# Patient Record
Sex: Male | Born: 1960 | Race: Asian | Hispanic: No | Marital: Married | Smoking: Former smoker
Health system: Southern US, Community
[De-identification: ages and names within clinical notes are randomized; demographics above are authoritative.]

## PROBLEM LIST (undated history)

## (undated) DIAGNOSIS — M109 Gout, unspecified: Secondary | ICD-10-CM

## (undated) DIAGNOSIS — E785 Hyperlipidemia, unspecified: Secondary | ICD-10-CM

## (undated) DIAGNOSIS — I10 Essential (primary) hypertension: Secondary | ICD-10-CM

## (undated) HISTORY — DX: Gout, unspecified: M10.9

## (undated) HISTORY — DX: Hyperlipidemia, unspecified: E78.5

---

## 2015-10-02 ENCOUNTER — Ambulatory Visit: Payer: Self-pay | Admitting: Internal Medicine

## 2015-10-16 ENCOUNTER — Ambulatory Visit (INDEPENDENT_AMBULATORY_CARE_PROVIDER_SITE_OTHER): Payer: BLUE CROSS/BLUE SHIELD | Admitting: Internal Medicine

## 2015-10-16 ENCOUNTER — Encounter: Payer: Self-pay | Admitting: Internal Medicine

## 2015-10-16 ENCOUNTER — Ambulatory Visit (INDEPENDENT_AMBULATORY_CARE_PROVIDER_SITE_OTHER)
Admission: RE | Admit: 2015-10-16 | Discharge: 2015-10-16 | Disposition: A | Discharge status: Home / Self Care | Payer: BLUE CROSS/BLUE SHIELD | Source: Ambulatory Visit | Attending: Internal Medicine | Admitting: Internal Medicine

## 2015-10-16 ENCOUNTER — Other Ambulatory Visit (INDEPENDENT_AMBULATORY_CARE_PROVIDER_SITE_OTHER): Payer: BLUE CROSS/BLUE SHIELD

## 2015-10-16 VITALS — BP 122/70 | HR 66 | Temp 98.1°F | Resp 12 | Ht 66.0 in | Wt 165.0 lb

## 2015-10-16 DIAGNOSIS — R05 Cough: Secondary | ICD-10-CM

## 2015-10-16 DIAGNOSIS — R059 Cough, unspecified: Secondary | ICD-10-CM

## 2015-10-16 DIAGNOSIS — Z0000 Encounter for general adult medical examination without abnormal findings: Secondary | ICD-10-CM | POA: Diagnosis not present

## 2015-10-16 LAB — COMPREHENSIVE METABOLIC PANEL
ALT: 28 U/L (ref 0–53)
AST: 29 U/L (ref 0–37)
Albumin: 3.9 g/dL (ref 3.5–5.2)
Alkaline Phosphatase: 56 U/L (ref 39–117)
BUN: 20 mg/dL (ref 6–23)
CO2: 30 meq/L (ref 19–32)
Calcium: 9.5 mg/dL (ref 8.4–10.5)
Chloride: 106 mEq/L (ref 96–112)
Creatinine, Ser: 1.16 mg/dL (ref 0.40–1.50)
GFR: 69.88 mL/min (ref >60.00)
GLUCOSE: 96 mg/dL (ref 70–99)
POTASSIUM: 5.5 meq/L — AB (ref 3.5–5.1)
SODIUM: 141 meq/L (ref 135–145)
Total Bilirubin: 0.3 mg/dL (ref 0.2–1.2)
Total Protein: 6.7 g/dL (ref 6.0–8.3)

## 2015-10-16 LAB — LIPID PANEL
CHOL/HDL RATIO: 4
Cholesterol: 177 mg/dL (ref 0–200)
HDL: 40.1 mg/dL (ref >39.00)
LDL CALC: 97 mg/dL (ref 0–99)
NONHDL: 136.62
Triglycerides: 198 mg/dL — ABNORMAL HIGH (ref 0.0–149.0)
VLDL: 39.6 mg/dL (ref 0.0–40.0)

## 2015-10-16 LAB — CBC
HCT: 49.7 % (ref 39.0–52.0)
Hemoglobin: 16.6 g/dL (ref 13.0–17.0)
MCHC: 33.3 g/dL (ref 30.0–36.0)
MCV: 87.2 fl (ref 78.0–100.0)
Platelets: 72 10*3/uL — ABNORMAL LOW (ref 150.0–400.0)
RBC: 5.7 Mil/uL (ref 4.22–5.81)
RDW: 14.3 % (ref 11.5–15.5)
WBC: 9.1 10*3/uL (ref 4.0–10.5)

## 2015-10-16 NOTE — Progress Notes (Signed)
   Subjective:    Patient ID: Aaron Bender, male    DOB: Dec 04, 1961, 54 y.o.   MRN: [id number]  HPI The patient is a new 54 YO man coming in for wellness. He speaks english poorly and his daughter is with him for translation. No concerns.   [facility], social history reviewed and updated.   Review of Systems  Constitutional: Negative for fever, activity change, appetite change, fatigue and unexpected weight change.  HENT: Negative.   Eyes: Negative.   Respiratory: Negative for cough, choking and wheezing.   Cardiovascular: Negative for chest pain, palpitations and leg swelling.  Gastrointestinal: Negative for nausea, abdominal pain, diarrhea, constipation and abdominal distention.  Musculoskeletal: Negative.   Skin: Negative.   Neurological: Negative.   Psychiatric/Behavioral: Negative.       Objective:   Physical Exam  Constitutional: He is oriented to person, place, and time. He appears well-developed and well-nourished.  HENT:  Head: Normocephalic and atraumatic.  Eyes: EOM are normal.  Neck: Normal range of motion.  Cardiovascular: Normal rate and regular rhythm.   Pulmonary/Chest: Effort normal and breath sounds normal. No respiratory distress. He has no wheezes. He has no rales.  Abdominal: Soft. Bowel sounds are normal. He exhibits no distension. There is no tenderness. There is no rebound.  Musculoskeletal: He exhibits no edema.  Neurological: He is alert and oriented to person, place, and time. Coordination normal.  Skin: Skin is warm and dry.  Psychiatric: He has a normal mood and affect.   Filed Vitals:   10/16/15 0903  BP: 122/70  Pulse: 66  Temp: 98.1 F (36.7 C)  TempSrc: Oral  Resp: 12  Height: 5\' 6"  (1.676 m)  Weight: 165 lb (74.844 kg)  SpO2: 97%      Assessment & Plan:

## 2015-10-16 NOTE — Assessment & Plan Note (Signed)
Daughter states history of hepatitis B in his home country. Checking hepatitis panel and immunity for hep B. Checking routine labs and chest x-ray. Counseled about colon cancer screening and they will think about it. Exercises and active.

## 2015-10-16 NOTE — Patient Instructions (Signed)
We will check the blood work today and the chest x-ray.   Think about doing the colon cancer test at home sometime in the next year.   We will also check on the hepatitis to see if it is gone.   Health Maintenance, Male A healthy lifestyle and preventative care can promote health and wellness.  Maintain regular health, dental, and eye exams.  Eat a healthy diet. Foods like vegetables, fruits, whole grains, low-fat dairy products, and lean protein foods contain the nutrients you need and are low in calories. Decrease your intake of foods high in solid fats, added sugars, and salt. Get information about a proper diet from your health care provider, if necessary.  Regular physical exercise is one of the most important things you can do for your health. Most adults should get at least 150 minutes of moderate-intensity exercise (any activity that increases your heart rate and causes you to sweat) each week. In addition, most adults need muscle-strengthening exercises on 2 or more days a week.   Maintain a healthy weight. The body mass index (BMI) is a screening tool to identify possible weight problems. It provides an estimate of body fat based on height and weight. Your health care provider can find your BMI and can help you achieve or maintain a healthy weight. For males 20 years and older:  A BMI below 18.5 is considered underweight.  A BMI of 18.5 to 24.9 is normal.  A BMI of 25 to 29.9 is considered overweight.  A BMI of 30 and above is considered obese.  Maintain normal blood lipids and cholesterol by exercising and minimizing your intake of saturated fat. Eat a balanced diet with plenty of fruits and vegetables. Blood tests for lipids and cholesterol should begin at age 720 and be repeated every 5 years. If your lipid or cholesterol levels are high, you are over age 650, or you are at high risk for heart disease, you may need your cholesterol levels checked more frequently.Ongoing high  lipid and cholesterol levels should be treated with medicines if diet and exercise are not working.  If you smoke, find out from your health care provider how to quit. If you do not use tobacco, do not start.  Lung cancer screening is recommended for adults aged 55-80 years who are at high risk for developing lung cancer because of a history of smoking. A yearly low-dose CT scan of the lungs is recommended for people who have at least a 30-pack-year history of smoking and are current smokers or have quit within the past 15 years. A pack year of smoking is smoking an average of 1 pack of cigarettes a day for 1 year (for example, a 30-pack-year history of smoking could mean smoking 1 pack a day for 30 years or 2 packs a day for 15 years). Yearly screening should continue until the smoker has stopped smoking for at least 15 years. Yearly screening should be stopped for people who develop a health problem that would prevent them from having lung cancer treatment.  If you choose to drink alcohol, do not have more than 2 drinks per day. One drink is considered to be 12 oz (360 mL) of beer, 5 oz (150 mL) of wine, or 1.5 oz (45 mL) of liquor.  Avoid the use of street drugs. Do not share needles with anyone. Ask for help if you need support or instructions about stopping the use of drugs.  High blood pressure causes heart disease and  increases the risk of stroke. High blood pressure is more likely to develop in:  People who have blood pressure in the end of the normal range (100-139/85-89 mm Hg).  People who are overweight or obese.  People who are African American.  If you are 60-62 years of age, have your blood pressure checked every 3-5 years. If you are 51 years of age or older, have your blood pressure checked every year. You should have your blood pressure measured twice--once when you are at a hospital or clinic, and once when you are not at a hospital or clinic. Record the average of the two  measurements. To check your blood pressure when you are not at a hospital or clinic, you can use:  An automated blood pressure machine at a pharmacy.  A home blood pressure monitor.  If you are 1-33 years old, ask your health care provider if you should take aspirin to prevent heart disease.  Diabetes screening involves taking a blood sample to check your fasting blood sugar level. This should be done once every 3 years after age 61 if you are at a normal weight and without risk factors for diabetes. Testing should be considered at a younger age or be carried out more frequently if you are overweight and have at least 1 risk factor for diabetes.  Colorectal cancer can be detected and often prevented. Most routine colorectal cancer screening begins at the age of 14 and continues through age 10. However, your health care provider may recommend screening at an earlier age if you have risk factors for colon cancer. On a yearly basis, your health care provider may provide home test kits to check for hidden blood in the stool. A small camera at the end of a tube may be used to directly examine the colon (sigmoidoscopy or colonoscopy) to detect the earliest forms of colorectal cancer. Talk to your health care provider about this at age 87 when routine screening begins. A direct exam of the colon should be repeated every 5-10 years through age 60, unless early forms of precancerous polyps or small growths are found.  People who are at an increased risk for hepatitis B should be screened for this virus. You are considered at high risk for hepatitis B if:  You were born in a country where hepatitis B occurs often. Talk with your health care provider about which countries are considered high risk.  Your parents were born in a high-risk country and you have not received a shot to protect against hepatitis B (hepatitis B vaccine).  You have HIV or AIDS.  You use needles to inject street drugs.  You live  with, or have sex with, someone who has hepatitis B.  You are a man who has sex with other men (MSM).  You get hemodialysis treatment.  You take certain medicines for conditions like cancer, organ transplantation, and autoimmune conditions.  Hepatitis C blood testing is recommended for all people born from [birth year] through 1964 and any individual with known risk factors for hepatitis C.  Healthy men should no longer receive prostate-specific antigen (PSA) blood tests as part of routine cancer screening. Talk to your health care provider about prostate cancer screening.  Testicular cancer screening is not recommended for adolescents or adult males who have no symptoms. Screening includes self-exam, a health care provider exam, and other screening tests. Consult with your health care provider about any symptoms you have or any concerns you have about testicular cancer.  Practice safe sex. Use condoms and avoid high-risk sexual practices to reduce the spread of sexually transmitted infections (STIs).  You should be screened for STIs, including gonorrhea and chlamydia if:  You are sexually active and are younger than 24 years.  You are older than 24 years, and your health care provider tells you that you are at risk for this type of infection.  Your sexual activity has changed since you were last screened, and you are at an increased risk for chlamydia or gonorrhea. Ask your health care provider if you are at risk.  If you are at risk of being infected with HIV, it is recommended that you take a prescription medicine daily to prevent HIV infection. This is called pre-exposure prophylaxis (PrEP). You are considered at risk if:  You are a man who has sex with other men (MSM).  You are a heterosexual man who is sexually active with multiple partners.  You take drugs by injection.  You are sexually active with a partner who has HIV.  Talk with your health care provider about whether you are at  high risk of being infected with HIV. If you choose to begin PrEP, you should first be tested for HIV. You should then be tested every 3 months for as long as you are taking PrEP.  Use sunscreen. Apply sunscreen liberally and repeatedly throughout the day. You should seek shade when your shadow is shorter than you. Protect yourself by wearing long sleeves, pants, a wide-brimmed hat, and sunglasses year round whenever you are outdoors.  Tell your health care provider of new moles or changes in moles, especially if there is a change in shape or color. Also, tell your health care provider if a mole is larger than the size of a pencil eraser.  A one-time screening for abdominal aortic aneurysm (AAA) and surgical repair of large AAAs by ultrasound is recommended for men aged 8-75 years who are current or former smokers.  Stay current with your vaccines (immunizations).   This information is not intended to replace advice given to you by your health care provider. Make sure you discuss any questions you have with your health care provider.   Document Released: 06/09/2008 Document Revised: 01/02/2015 Document Reviewed: 05/09/2011 Elsevier Interactive Patient Education [employer].

## 2015-10-16 NOTE — Progress Notes (Signed)
Pre visit review using our clinic review tool, if applicable. No additional management support is needed unless otherwise documented below in the visit note. 

## 2015-10-18 LAB — ACUTE HEP PANEL AND HEP B SURFACE AB
HCV Ab: NEGATIVE
HEP B S AB: NEGATIVE
Hep A IgM: NONREACTIVE
Hep B C IgM: NONREACTIVE
Hepatitis B Surface Ag: POSITIVE — AB

## 2015-10-18 LAB — HEPATITIS B SURF AG CONFIRMATION: Hepatitis B Surf Ag Confirmation: POSITIVE — AB

## 2015-10-20 ENCOUNTER — Other Ambulatory Visit: Payer: Self-pay | Admitting: Internal Medicine

## 2015-10-20 DIAGNOSIS — B181 Chronic viral hepatitis B without delta-agent: Secondary | ICD-10-CM

## 2016-04-27 ENCOUNTER — Telehealth: Payer: Self-pay | Admitting: Internal Medicine

## 2016-04-27 DIAGNOSIS — R768 Other specified abnormal immunological findings in serum: Secondary | ICD-10-CM

## 2016-04-27 NOTE — Telephone Encounter (Signed)
Patient needs a referral to [facility] for positive Hep B dx. Are you able to place in Dr. Lawana Chambersrawfords absence?

## 2016-04-27 NOTE — Telephone Encounter (Signed)
Pt's daughter called want to speak to the assistant. Pt saw Dr. Okey Duprerawford last year and lab result was positive for Hep B ( they did not see this request until now). She was wondering what does she need to do in order for [facility]. Aaron Bender [facility] phn # [phone]. Advise dr. Okey Duprerawford is our of the office but want to speak to the assistant.

## 2016-04-27 NOTE — Telephone Encounter (Signed)
Referral placed.

## 2016-06-21 ENCOUNTER — Other Ambulatory Visit (HOSPITAL_COMMUNITY): Payer: Self-pay | Admitting: Nurse Practitioner

## 2016-06-21 DIAGNOSIS — B182 Chronic viral hepatitis C: Secondary | ICD-10-CM

## 2016-08-11 ENCOUNTER — Ambulatory Visit (HOSPITAL_COMMUNITY)
Admission: RE | Admit: 2016-08-11 | Discharge: 2016-08-11 | Disposition: A | Discharge status: Home / Self Care | Payer: BLUE CROSS/BLUE SHIELD | Source: Ambulatory Visit | Attending: Nurse Practitioner | Admitting: Nurse Practitioner

## 2016-08-11 DIAGNOSIS — K824 Cholesterolosis of gallbladder: Secondary | ICD-10-CM | POA: Insufficient documentation

## 2016-08-11 DIAGNOSIS — K838 Other specified diseases of biliary tract: Secondary | ICD-10-CM | POA: Diagnosis not present

## 2016-08-11 DIAGNOSIS — B182 Chronic viral hepatitis C: Secondary | ICD-10-CM | POA: Diagnosis present

## 2017-03-07 IMAGING — DX DG CHEST 2V
2 series · 2 of 2 positions shown · non-contrast
Comparison: None.

CLINICAL DATA: Cough

EXAM:
CHEST  2 VIEW

[chest pa]
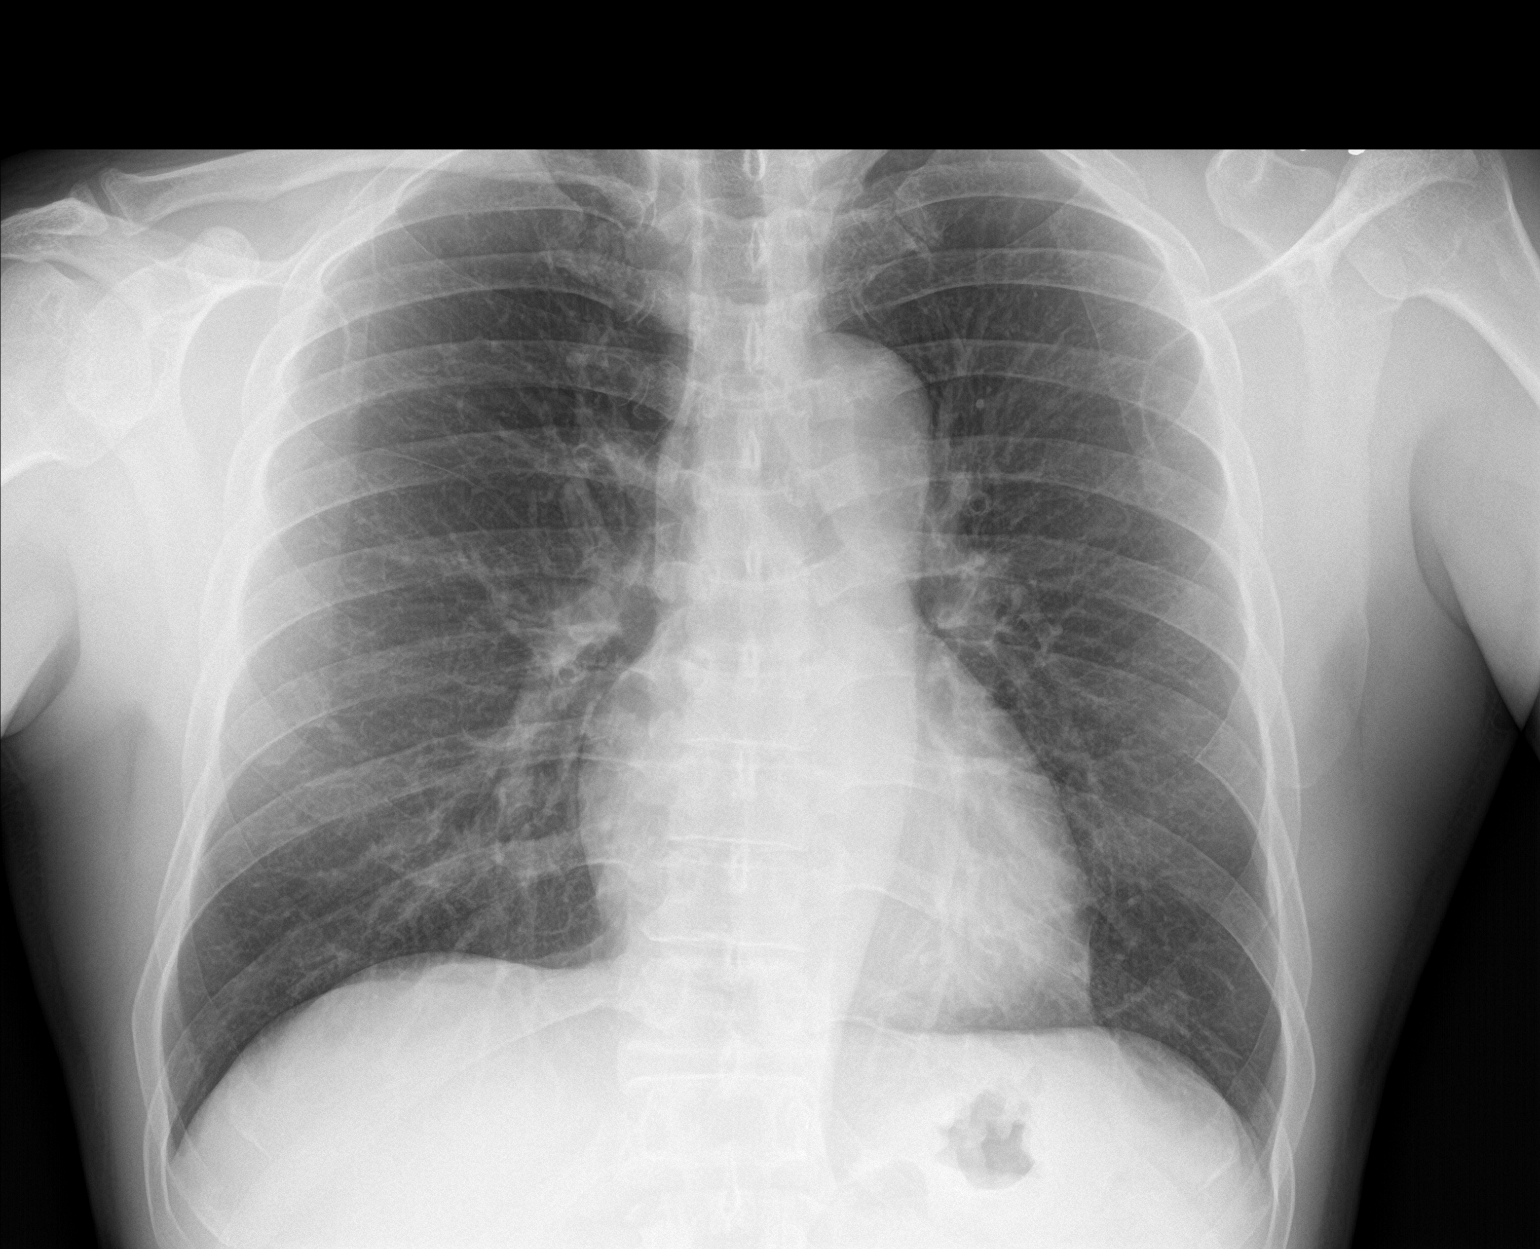

[chest lat]
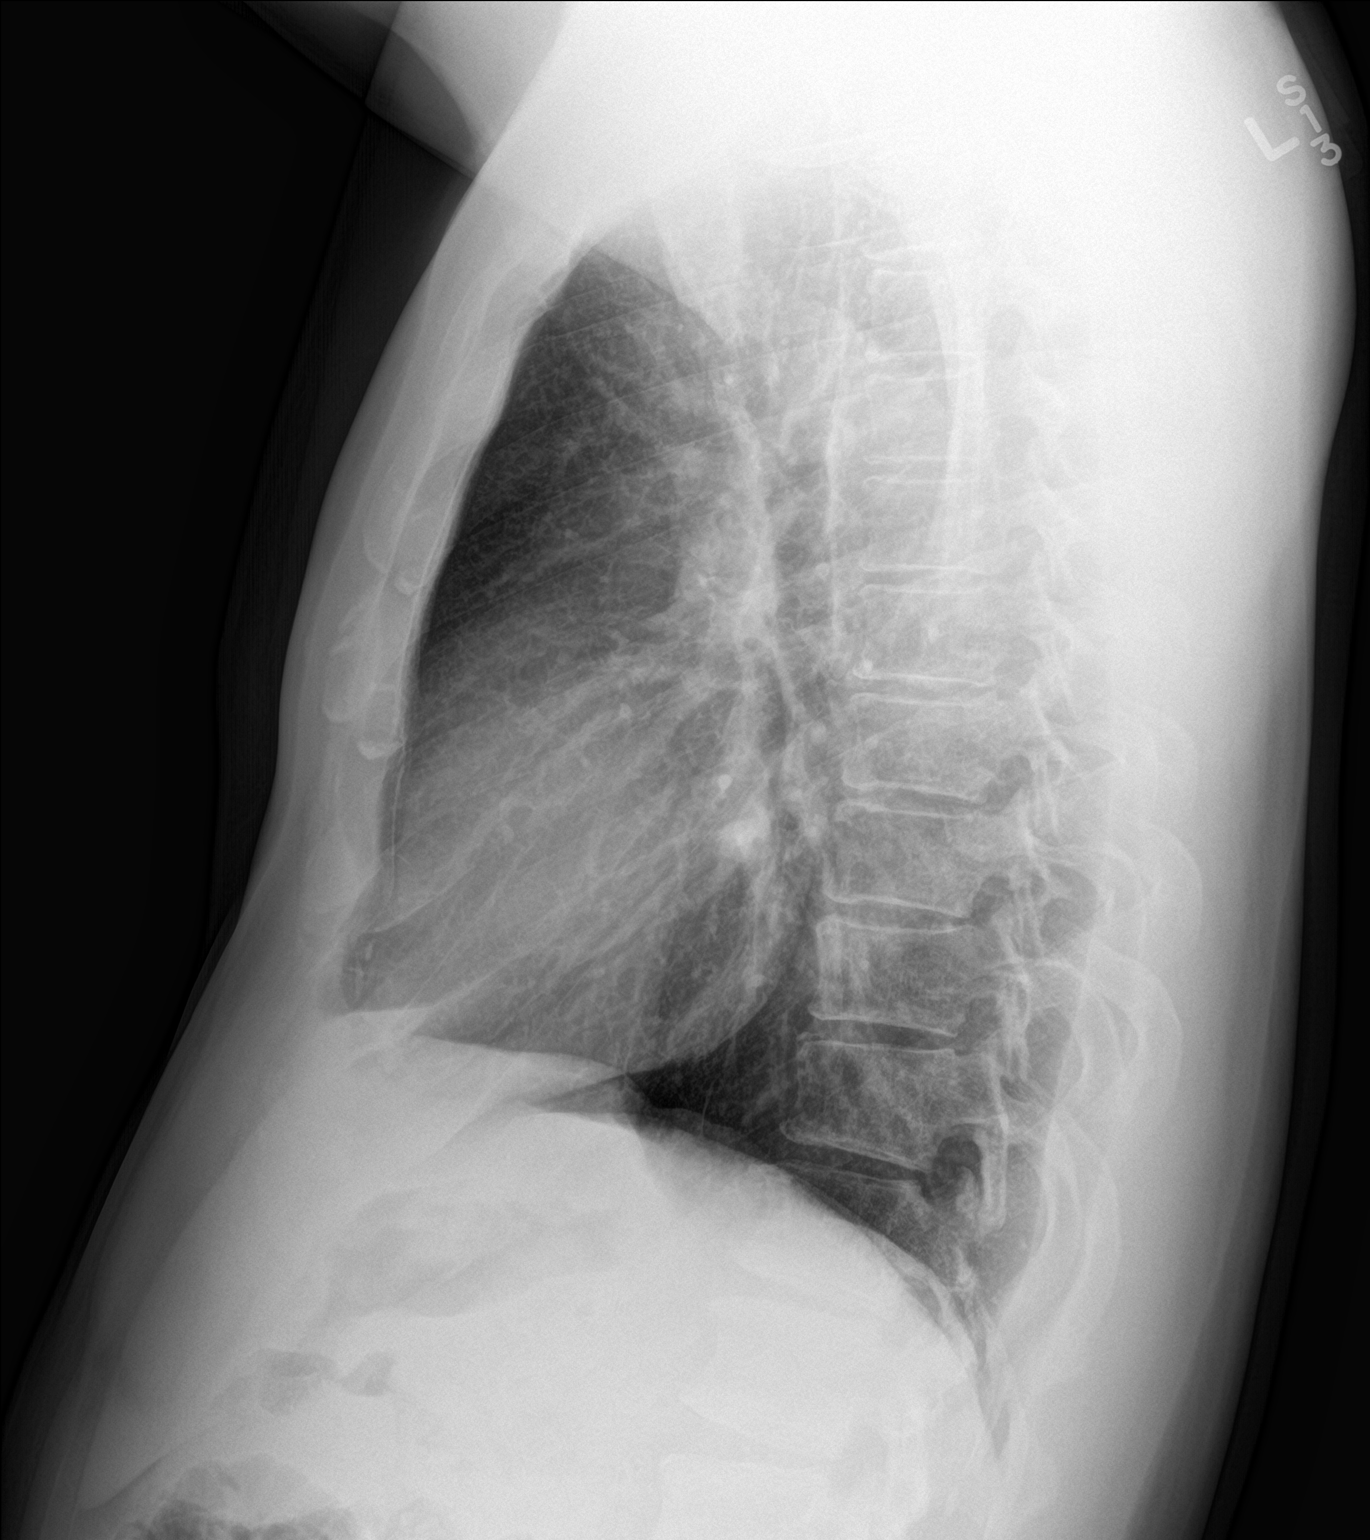

[2 of 2 positions shown; findings below may reference images not displayed]

FINDINGS: The heart size and mediastinal contours are within normal limits.
Both lungs are clear. The visualized skeletal structures are
unremarkable.
IMPRESSION: No active cardiopulmonary disease.

## 2017-11-14 IMAGING — US US ABDOMEN COMPLETE W/ ELASTOGRAPHY
1 series · 13 of 14 positions shown · non-contrast
Comparison: None.

CLINICAL DATA: Chronic hepatitis-B.



[Series 1: us abdomen complete w/ elastography · 0.12mm/px · 13 of 14 slices shown]
[im 1/14]
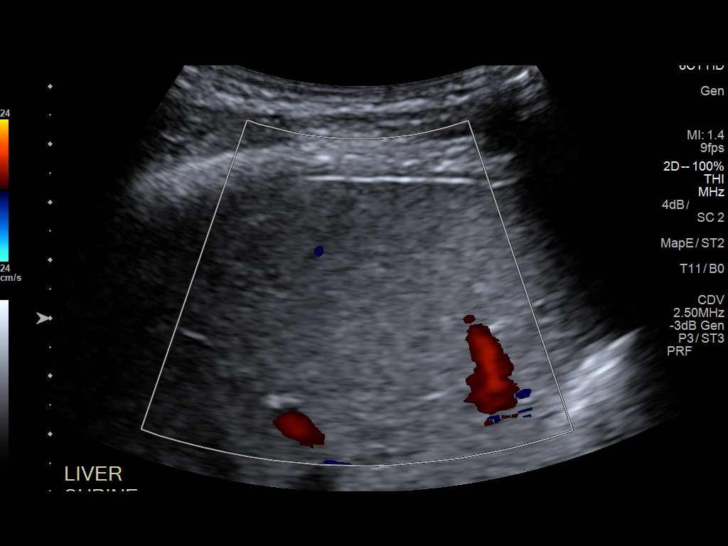
[im 2/14]
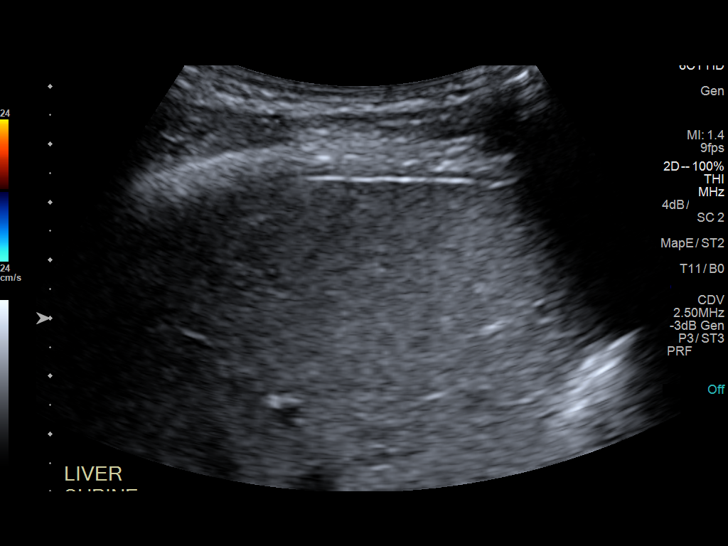
[im 3/14]
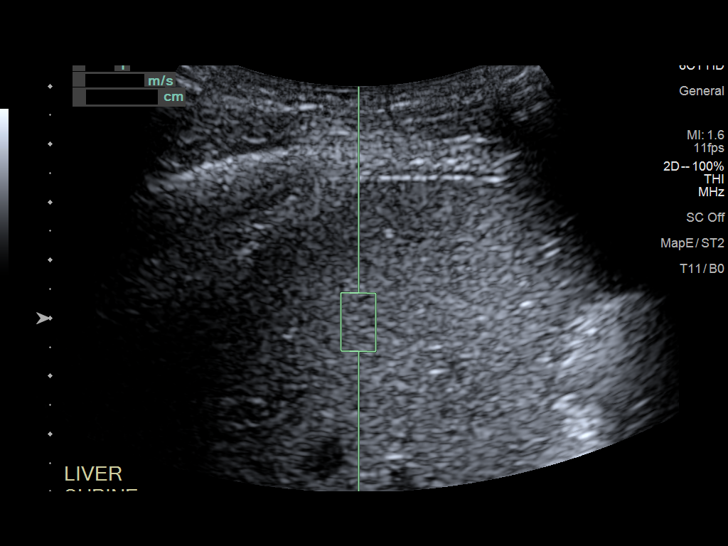
[im 4/14]
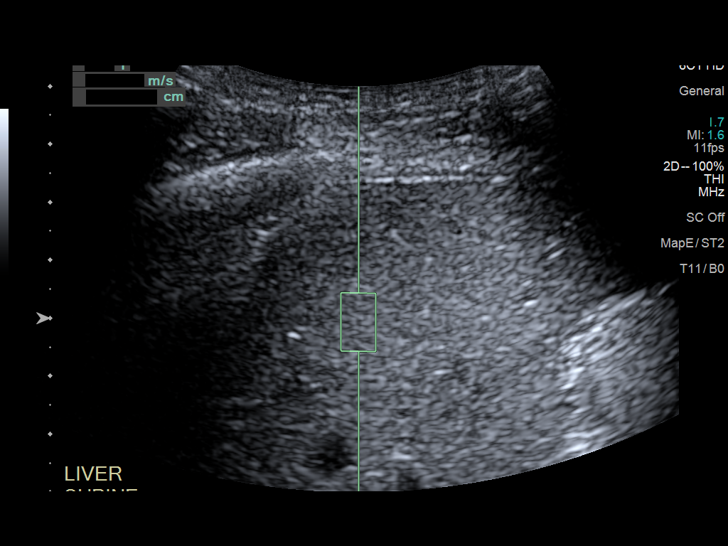
[im 5/14]
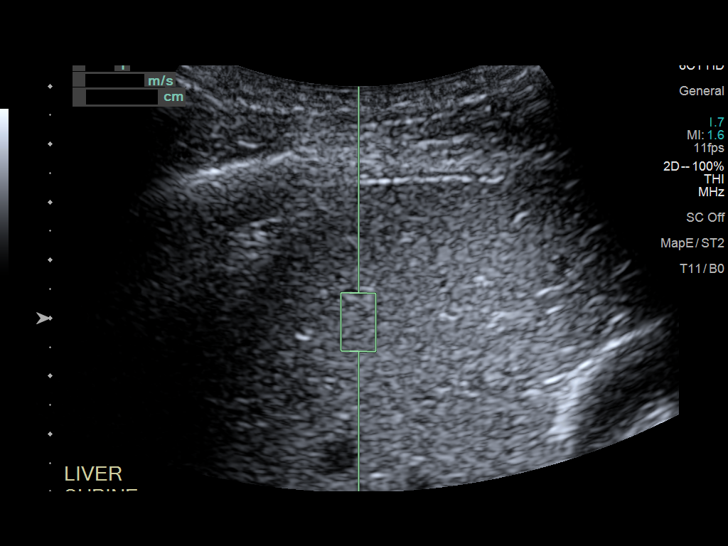
[im 6/14]
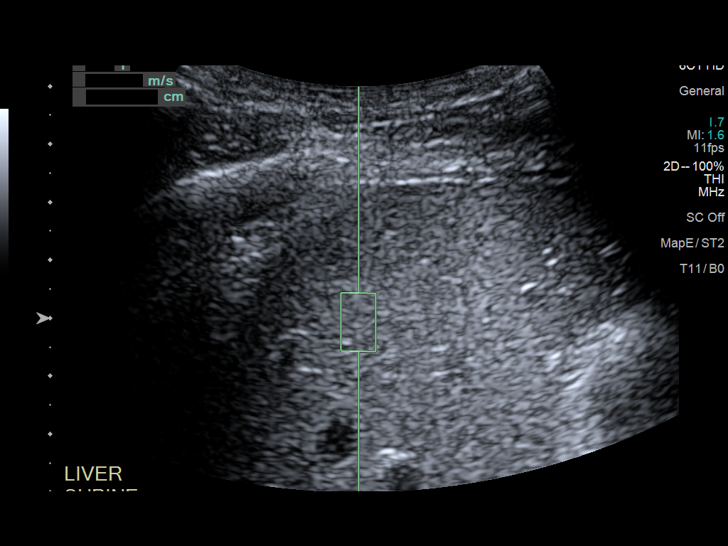
[im 8/14]
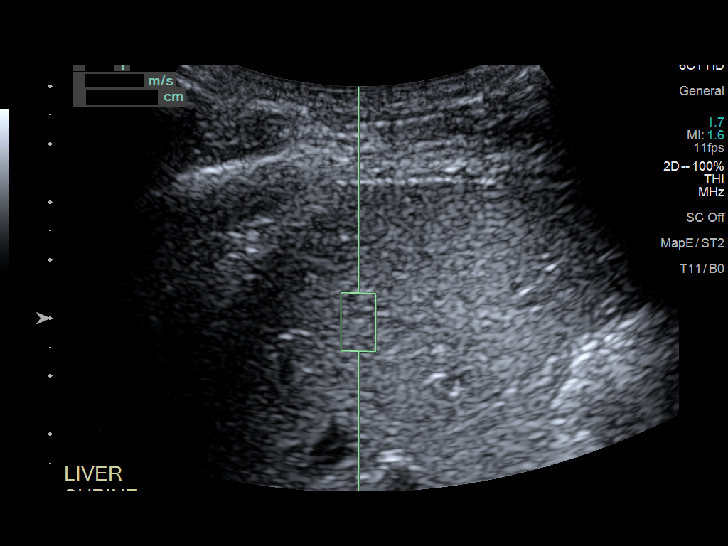
[im 9/14]
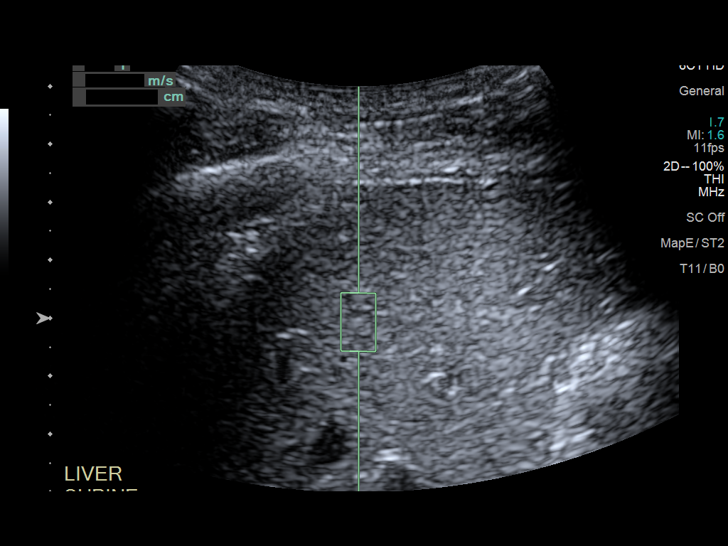
[im 10/14]
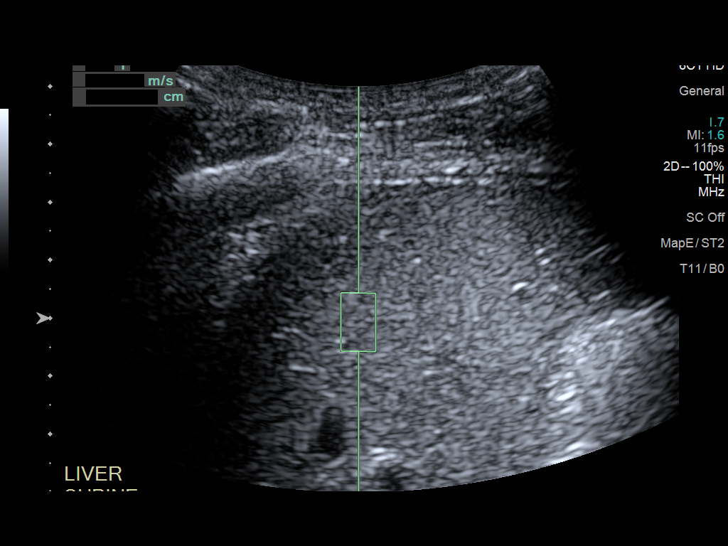
[im 11/14]
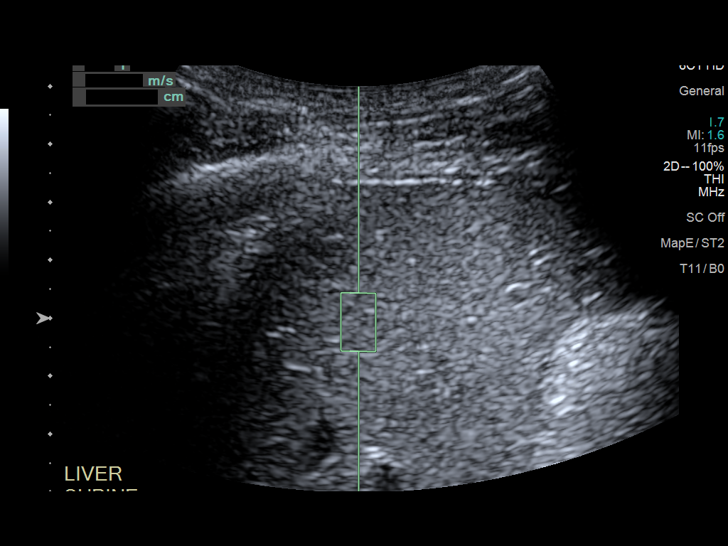
[im 12/14]
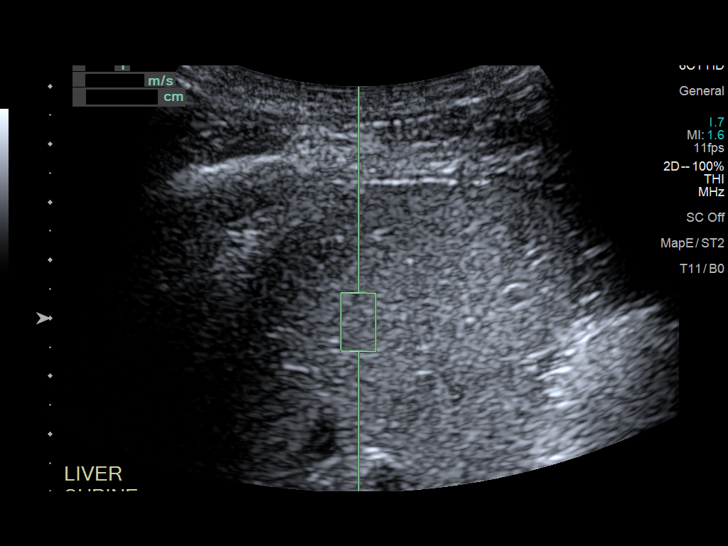
[im 13/14]
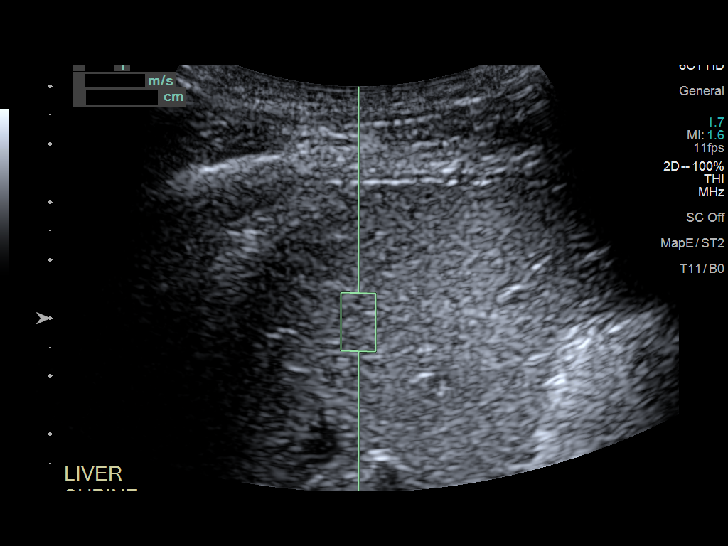
[im 14/14]
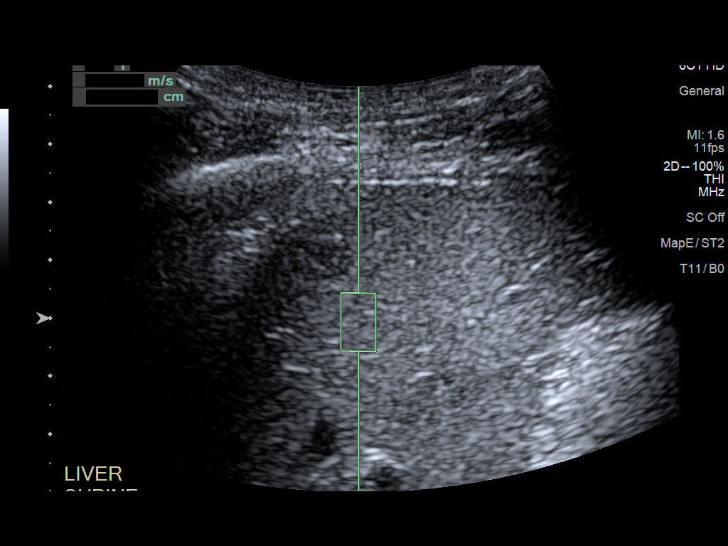

[13 of 14 positions shown; findings below may reference images not displayed]

FINDINGS: ULTRASOUND ABDOMEN

Gallbladder: No gallstones or wall thickening visualized. Two tiny
nonmobile gallbladder polyps are seen measuring up to 4 mm. No
sonographic Murphy sign noted by sonographer.

Common bile duct: Diameter: 11 mm, which is mildly dilated. Distal
common bile duct not visualized.

Liver: No focal lesion identified. Within normal limits in
parenchymal echogenicity.

IVC: No abnormality visualized.

Pancreas: Visualized portion unremarkable.

Spleen: Size and appearance within normal limits.

Right Kidney: Length: 10.5 cm. Echogenicity within normal limits. No
mass or hydronephrosis visualized.

Left Kidney: Length: 11.6 cm. Echogenicity within normal limits. No
mass or hydronephrosis visualized.

Abdominal aorta: No aneurysm visualized.

Other findings: None.

ULTRASOUND HEPATIC ELASTOGRAPHY

Device: Siemens Helix VTQ

Patient position:  Supine

Transducer 6C1

Number of measurements:  10

Hepatic Segment:  8

Median velocity:   1.02  m/sec

IQR:

IQR/Median velocity ratio

Corresponding Metavir fibrosis score:  F0/F1

Risk of fibrosis: Minimal

Limitations of exam: None

Pertinent findings noted on other imaging exams:  None

Please note that abnormal shear wave velocities may also be
identified in clinical settings other than with hepatic fibrosis,
such as: acute hepatitis, elevated right heart and central venous
pressures including use of beta blockers, Amoez disease
(Petter), infiltrative processes such as
mastocytosis/amyloidosis/infiltrative tumor, extrahepatic
cholestasis, in the post-prandial state, and liver transplantation.
Correlation with patient history, laboratory data, and clinical
condition recommended.
IMPRESSION: Tiny gallbladder polyps noted. No evidence of gallstones or
gallbladder wall thickening.

Biliary ductal dilatation with common bile duct measuring 11 mm.
Etiology not visualized by ultrasound. Recommend correlation with
liver function tests; if abnormal, consider MRCP for further
evaluation.

Median hepatic shear wave velocity is calculated at 1.02 m/sec.

Corresponding Metavir fibrosis score is F0/F1.

Risk of fibrosis is minimal.

Follow-up:  None required

## 2024-07-24 ENCOUNTER — Ambulatory Visit: Admitting: Nurse Practitioner

## 2024-09-06 ENCOUNTER — Encounter: Payer: Self-pay | Admitting: Nurse Practitioner

## 2024-09-06 ENCOUNTER — Ambulatory Visit (INDEPENDENT_AMBULATORY_CARE_PROVIDER_SITE_OTHER): Admitting: Nurse Practitioner

## 2024-09-06 VITALS — BP 170/100 | HR 77 | Temp 97.3°F | Ht 66.0 in | Wt 139.8 lb

## 2024-09-06 DIAGNOSIS — I10 Essential (primary) hypertension: Secondary | ICD-10-CM | POA: Insufficient documentation

## 2024-09-06 DIAGNOSIS — Z1159 Encounter for screening for other viral diseases: Secondary | ICD-10-CM

## 2024-09-06 DIAGNOSIS — Z114 Encounter for screening for human immunodeficiency virus [HIV]: Secondary | ICD-10-CM

## 2024-09-06 DIAGNOSIS — Z1211 Encounter for screening for malignant neoplasm of colon: Secondary | ICD-10-CM

## 2024-09-06 DIAGNOSIS — M1A9XX0 Chronic gout, unspecified, without tophus (tophi): Secondary | ICD-10-CM | POA: Insufficient documentation

## 2024-09-06 DIAGNOSIS — Z23 Encounter for immunization: Secondary | ICD-10-CM

## 2024-09-06 DIAGNOSIS — Z0000 Encounter for general adult medical examination without abnormal findings: Secondary | ICD-10-CM | POA: Diagnosis not present

## 2024-09-06 MED ORDER — VALSARTAN-HYDROCHLOROTHIAZIDE 160-25 MG PO TABS: 1.0000 | ORAL_TABLET | Freq: Every day | ORAL | 1 refills | Status: DC

## 2024-09-06 MED ORDER — COLCHICINE 0.6 MG PO TABS: ORAL_TABLET | ORAL | 0 refills | Status: AC

## 2024-09-06 NOTE — Patient Instructions (Addendum)
 It was great to see you!  We are checking your labs today and will let you know the results via mychart/phone.   Start valsartan -hydrochlorothiazide  1 tablet daily for blood pressure   When you have a gout flare-up: take 2 tablets of colchicine  at the first sign of pain, then 1 tablet daily until pain gone   Start checking your blood pressure at home and write it down  I have ordered the cologuard kit which will be mailed to your house.   Let's follow-up in 3-4 weeks, sooner if you have concerns.  If a referral was placed today, you will be contacted for an appointment. Please note that routine referrals can sometimes take up to 3-4 weeks to process. Please call our office if you haven't heard anything after this time frame.  Take care,  Tinnie Harada, NP

## 2024-09-06 NOTE — Assessment & Plan Note (Signed)
 Health maintenance reviewed and updated. Discussed nutrition, exercise. Follow-up 1 year.

## 2024-09-06 NOTE — Assessment & Plan Note (Signed)
 He experiences chronic gout with 3-4 flare-ups annually, triggered by diet. The current flare-up has lasted over ten days. Colchicine  is chosen for acute management due to an aspirin allergy. Prescribe colchicine  0.6mg  for acute gout flare-ups: take two pills at the first sign of a flare, then one pill daily until pain resolves. Check uric acid levels. Discuss dietary modifications to avoid gout triggers. Provide a note for work to allow intermittent leave during flare-ups.

## 2024-09-06 NOTE — Assessment & Plan Note (Signed)
 Chronic, not controlled. His blood pressure is 170/100 mmHg with elevated home readings. He has a high salt and caffeine intake, increasing cardiovascular and renal risks. He has not taken hypertension medication before. Prescribe valsartan -hctz 160-25mg  once daily. Instruct him to monitor blood pressure daily and record readings. Advise reducing dietary salt intake to less than 2000 mg per day. Check CMP, CBC, lipid panel today.

## 2024-09-06 NOTE — Progress Notes (Signed)
 New Patient Visit  BP (!) 170/100 (BP Location: Right Arm, Cuff Size: Normal)   Pulse 77   Temp (!) 97.3 F (36.3 C)   Ht 5' 6 (1.676 m)   Wt 139 lb 12.8 oz (63.4 kg)   SpO2 100%   BMI 22.56 kg/m    Subjective:    Patient ID: Aaron Bender, male    DOB: [birth year], 63 y.o.   MRN: [id number]  CC: Chief Complaint  Patient presents with   Annual Exam    With fasting labs, concerns with gout of right foot, Flu Vaccine   Visit completed with in-person interpreter   HPI: Aaron Bender is a 63 y.o. male presents for new patient visit to establish care.  Introduced to Publishing rights manager role and practice setting.  All questions answered.  Discussed provider/patient relationship and expectations.  Discussed the use of AI scribe software for clinical note transcription with the patient, who gave verbal consent to proceed.  History of Present Illness   Aaron Bender is a 63 year old male with gout who presents to establish care for an annual physical exam and management of gout symptoms.  He experiences gout primarily in his right foot, with the current episode lasting over ten days. Topical oil reduces inflammation, but pain persists, especially when standing or working. Tylenol provides minimal relief, unlike previous episodes where it was effective within two days. Pain decreases with rest. He has flare-ups three to four times a year, often triggered by beef and seafood. 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 He would like a work note for when he has flare-ups.   He has elevated blood pressure, with home readings around 150/70 mmHg, and is not on antihypertensive medication. He smokes five cigarettes daily and drinks alcohol occasionally. He works in a physically Designer, fashion/clothing job and does not engage in additional exercise.     Depression and Anxiety Screen done:     09/06/2024    3:00 PM  Depression screen PHQ 2/9  Decreased Interest 1  Down, Depressed, Hopeless 1   PHQ - 2 Score 2  Altered sleeping 1  Tired, decreased energy 1  Change in appetite 0  Feeling bad or failure about yourself  0  Trouble concentrating 0  Moving slowly or fidgety/restless 0  Suicidal thoughts 0  PHQ-9 Score 4  Difficult doing work/chores Somewhat difficult      09/06/2024    3:00 PM  GAD 7 : Generalized Anxiety Score  Nervous, Anxious, on Edge 0  Control/stop worrying 2  Worry too much - different things 2  Trouble relaxing 2  Restless 2  Easily annoyed or irritable 2  Afraid - awful might happen 2  Total GAD 7 Score 12  Anxiety Difficulty Somewhat difficult    Past Medical History:  Diagnosis Date   Gout    Hyperlipidemia     Past Surgical History:  Procedure Laterality Date   HERNIA REPAIR      Family History  Problem Relation Age of Onset   Diabetes Mother      Social History   Tobacco Use   Smoking status: Some Days    Current packs/day: 0.25    Average packs/day: 0.3 packs/day for 40.7 years (10.2 ttl pk-yrs)    Types: Cigarettes    Start date: 1985    Passive exposure: Current   Smokeless tobacco: Never  Vaping Use   Vaping status: Never Used  Substance Use Topics  Alcohol use: Not Currently   Drug use: No    Current Outpatient Medications on File Prior to Visit  Medication Sig Dispense Refill   Acetaminophen (TYLENOL PO) Take by mouth as needed.     No current facility-administered medications on file prior to visit.     Review of Systems  Constitutional: Negative.   HENT: Negative.    Eyes: Negative.   Respiratory: Negative.    Cardiovascular: Negative.   Gastrointestinal:  Positive for diarrhea (occasionally). Negative for abdominal distention, abdominal pain, blood in stool and constipation.  Endocrine: Negative.   Genitourinary: Negative.   Musculoskeletal:  Positive for arthralgias (right foot).  Skin: Negative.   Neurological: Negative.   Psychiatric/Behavioral: Negative.        Objective:    BP (!)  170/100 (BP Location: Right Arm, Cuff Size: Normal)   Pulse 77   Temp (!) 97.3 F (36.3 C)   Ht 5' 6 (1.676 m)   Wt 139 lb 12.8 oz (63.4 kg)   SpO2 100%   BMI 22.56 kg/m   Wt Readings from Last 3 Encounters:  09/06/24 139 lb 12.8 oz (63.4 kg)  10/16/15 165 lb (74.8 kg)    BP Readings from Last 3 Encounters:  09/06/24 (!) 170/100  10/16/15 122/70    Physical Exam Vitals and nursing note reviewed.  Constitutional:      General: He is not in acute distress.    Appearance: Normal appearance.  HENT:     Head: Normocephalic and atraumatic.     Right Ear: Tympanic membrane, ear canal and external ear normal.     Left Ear: Tympanic membrane, ear canal and external ear normal.     Mouth/Throat:     Mouth: Mucous membranes are moist.     Pharynx: No posterior oropharyngeal erythema.  Eyes:     Conjunctiva/sclera: Conjunctivae normal.  Cardiovascular:     Rate and Rhythm: Normal rate and regular rhythm.     Pulses: Normal pulses.     Heart sounds: Normal heart sounds.  Pulmonary:     Effort: Pulmonary effort is normal.     Breath sounds: Normal breath sounds.  Abdominal:     Palpations: Abdomen is soft.     Tenderness: There is no abdominal tenderness.  Musculoskeletal:        General: Tenderness (right great toe) present. Normal range of motion.     Cervical back: Normal range of motion and neck supple. No tenderness.     Right lower leg: No edema.     Left lower leg: No edema.  Lymphadenopathy:     Cervical: No cervical adenopathy.  Skin:    General: Skin is warm and dry.  Neurological:     General: No focal deficit present.     Mental Status: He is alert and oriented to person, place, and time.     Cranial Nerves: No cranial nerve deficit.     Coordination: Coordination normal.     Gait: Gait normal.  Psychiatric:        Mood and Affect: Mood normal.        Behavior: Behavior normal.        Thought Content: Thought content normal.        Judgment: Judgment normal.        Assessment & Plan:   Problem List Items Addressed This Visit       Cardiovascular and Mediastinum   Primary hypertension   Chronic, not controlled. His blood pressure is 170/100  mmHg with elevated home readings. He has a high salt and caffeine intake, increasing cardiovascular and renal risks. He has not taken hypertension medication before. Prescribe valsartan -hctz 160-25mg  once daily. Instruct him to monitor blood pressure daily and record readings. Advise reducing dietary salt intake to less than 2000 mg per day. Check CMP, CBC, lipid panel today.       Relevant Medications   valsartan -hydrochlorothiazide  (DIOVAN -HCT) 160-25 MG tablet   Other Relevant Orders   Lipid panel (Completed)   Comprehensive metabolic panel with GFR (Completed)   CBC with Differential/Platelet     Musculoskeletal and Integument   Chronic gout involving toe of right foot without tophus   He experiences chronic gout with 3-4 flare-ups annually, triggered by diet. The current flare-up has lasted over ten days. Colchicine  is chosen for acute management due to an aspirin allergy. Prescribe colchicine  0.6mg  for acute gout flare-ups: take two pills at the first sign of a flare, then one pill daily until pain resolves. Check uric acid levels. Discuss dietary modifications to avoid gout triggers. Provide a note for work to allow intermittent leave during flare-ups.       Relevant Medications   Acetaminophen (TYLENOL PO)   colchicine  0.6 MG tablet   Other Relevant Orders   Uric acid (Completed)     Other   Routine general medical examination at a health care facility - Primary   Health maintenance reviewed and updated. Discussed nutrition, exercise. Follow-up 1 year.        Other Visit Diagnoses       Screening for HIV (human immunodeficiency virus)       Screen HIV today   Relevant Orders   HIV Antibody (routine testing w rflx)     Encounter for hepatitis C screening test for low risk patient        Screen hep C today   Relevant Orders   Hepatitis C antibody     Immunization due       Flu vaccine given today   Relevant Orders   Flu vaccine trivalent PF, 6mos and older(Flulaval,Afluria,Fluarix,Fluzone) (Completed)     Screen for colon cancer       Cologuard ordered today   Relevant Orders   Cologuard       Follow up plan: Return in about 4 weeks (around 10/04/2024) for 3-4 weeks , HTN.  Dawson Hollman A Deola Rewis

## 2024-09-07 LAB — COMPREHENSIVE METABOLIC PANEL WITH GFR
AG Ratio: 1.5 (calc) (ref 1.0–2.5)
ALT: 25 U/L (ref 9–46)
AST: 31 U/L (ref 10–35)
Albumin: 4 g/dL (ref 3.6–5.1)
Alkaline phosphatase (APISO): 79 U/L (ref 35–144)
BUN: 15 mg/dL (ref 7–25)
CO2: 29 mmol/L (ref 20–32)
Calcium: 8.9 mg/dL (ref 8.6–10.3)
Chloride: 104 mmol/L (ref 98–110)
Creat: 1.02 mg/dL (ref 0.70–1.35)
Globulin: 2.6 g/dL (ref 1.9–3.7)
Glucose, Bld: 89 mg/dL (ref 65–99)
Potassium: 4.2 mmol/L (ref 3.5–5.3)
Sodium: 139 mmol/L (ref 135–146)
Total Bilirubin: 0.6 mg/dL (ref 0.2–1.2)
Total Protein: 6.6 g/dL (ref 6.1–8.1)
eGFR: 83 mL/min/1.73m2 (ref >=60)

## 2024-09-07 LAB — CBC WITH DIFFERENTIAL/PLATELET
Absolute Lymphocytes: 1543 {cells}/uL (ref 850–3900)
Absolute Monocytes: 573 {cells}/uL (ref 200–950)
Basophils Absolute: 31 {cells}/uL (ref 0–200)
Basophils Relative: 0.5 %
Eosinophils Absolute: 79 {cells}/uL (ref 15–500)
Eosinophils Relative: 1.3 %
HCT: 50.4 % — ABNORMAL HIGH (ref 38.5–50.0)
Hemoglobin: 16.8 g/dL (ref 13.2–17.1)
MCH: 29.5 pg (ref 27.0–33.0)
MCHC: 33.3 g/dL (ref 32.0–36.0)
MCV: 88.4 fL (ref 80.0–100.0)
Monocytes Relative: 9.4 %
Neutro Abs: 3874 {cells}/uL (ref 1500–7800)
Neutrophils Relative %: 63.5 %
Platelets: 40 Thousand/uL — ABNORMAL LOW (ref 140–400)
RBC: 5.7 Million/uL (ref 4.20–5.80)
RDW: 13.9 % (ref 11.0–15.0)
Total Lymphocyte: 25.3 %
WBC: 6.1 Thousand/uL (ref 3.8–10.8)

## 2024-09-07 LAB — HIV ANTIBODY (ROUTINE TESTING W REFLEX)
HIV 1&2 Ab, 4th Generation: NONREACTIVE
HIV FINAL INTERPRETATION: NEGATIVE

## 2024-09-07 LAB — URIC ACID: Uric Acid, Serum: 7.7 mg/dL (ref 4.0–8.0)

## 2024-09-07 LAB — LIPID PANEL
Cholesterol: 188 mg/dL (ref <200)
HDL: 58 mg/dL (ref >=40)
LDL Cholesterol (Calc): 115 mg/dL — ABNORMAL HIGH
Non-HDL Cholesterol (Calc): 130 mg/dL — ABNORMAL HIGH (ref <130)
Total CHOL/HDL Ratio: 3.2 (calc) (ref <5.0)
Triglycerides: 63 mg/dL (ref <150)

## 2024-09-07 LAB — HEPATITIS C ANTIBODY: Hepatitis C Ab: NONREACTIVE

## 2024-09-09 ENCOUNTER — Ambulatory Visit: Payer: Self-pay | Admitting: Nurse Practitioner

## 2024-09-09 DIAGNOSIS — R195 Other fecal abnormalities: Secondary | ICD-10-CM

## 2024-09-09 DIAGNOSIS — D696 Thrombocytopenia, unspecified: Secondary | ICD-10-CM

## 2024-09-21 LAB — COLOGUARD: COLOGUARD: POSITIVE — AB

## 2024-10-04 ENCOUNTER — Encounter: Payer: Self-pay | Admitting: Nurse Practitioner

## 2024-10-04 ENCOUNTER — Ambulatory Visit: Payer: Self-pay | Admitting: Nurse Practitioner

## 2024-10-04 ENCOUNTER — Ambulatory Visit: Admitting: Nurse Practitioner

## 2024-10-04 VITALS — BP 102/68 | HR 68 | Temp 97.7°F | Ht 66.0 in | Wt 139.4 lb

## 2024-10-04 DIAGNOSIS — F4323 Adjustment disorder with mixed anxiety and depressed mood: Secondary | ICD-10-CM | POA: Diagnosis not present

## 2024-10-04 DIAGNOSIS — I10 Essential (primary) hypertension: Secondary | ICD-10-CM

## 2024-10-04 DIAGNOSIS — R195 Other fecal abnormalities: Secondary | ICD-10-CM | POA: Diagnosis not present

## 2024-10-04 LAB — BASIC METABOLIC PANEL WITH GFR
BUN: 23 mg/dL (ref 6–23)
CO2: 29 meq/L (ref 19–32)
Calcium: 8.6 mg/dL (ref 8.4–10.5)
Chloride: 101 meq/L (ref 96–112)
Creatinine, Ser: 1.13 mg/dL (ref 0.40–1.50)
GFR: 69.71 mL/min (ref >60.00)
Glucose, Bld: 100 mg/dL — ABNORMAL HIGH (ref 70–99)
Potassium: 3.9 meq/L (ref 3.5–5.1)
Sodium: 137 meq/L (ref 135–145)

## 2024-10-04 MED ORDER — VALSARTAN 80 MG PO TABS: 80.0000 mg | ORAL_TABLET | Freq: Every day | ORAL | 0 refills | Status: AC

## 2024-10-04 NOTE — Assessment & Plan Note (Signed)
 Chronic, ongoing. He experiences dizziness on standing due to blood pressure medication. Reducing the dose of valsartan  and hydrochlorothiazide  has decreased dizziness by 50%. Will switch to valsartan  80mg  daily. Monitor blood pressure at home. Check BMP today. Follow up in 2-3 weeks to reassess.

## 2024-10-04 NOTE — Assessment & Plan Note (Addendum)
 Chronic, not controlled. He is under significant stress from personal and family issues, leading to anxiety, concentration difficulties, poor sleep, and lack of interest. PHQ 9 is a 12 and GAD 7 is a 14. No SI/HI. Night shifts exacerbate sleep problems. Provide a work note for time off from October 12 to the end of the month. Encourage stress management techniques such as walking, deep breathing, and using tea for sleep. Declined therapy and medications. Follow-up in 2-3 weeks.

## 2024-10-04 NOTE — Patient Instructions (Signed)
 It was great to see you!  Call the GI office to schedule an appointment:   [facility]  [address], [place], [place] [address] Phone: [phone]  We are checking your labs today and will let you know the results via mychart/phone.   Decrease the blood pressure medicine and start the new pill valsartan  80mg  daily   Keep checking your blood pressure at home  Let's follow-up in 3 months, sooner if you have concerns.  If a referral was placed today, you will be contacted for an appointment. Please note that routine referrals can sometimes take up to 3-4 weeks to process. Please call our office if you haven't heard anything after this time frame.  Take care,  Tinnie Harada, NP

## 2024-10-04 NOTE — Progress Notes (Signed)
 Established Patient Office Visit  Subjective   Patient ID: Aaron Bender, male    DOB: 1960/12/29  Age: 63 y.o. MRN: [id number]  Chief Complaint  Patient presents with   Hypertension    Follow up, concerns about GI referral    HPI  Discussed the use of AI scribe software for clinical note transcription with the patient, who gave verbal consent to proceed.  History of Present Illness   Aaron Bender is a 63 year old male who presents with dizziness and stress-related issues.  He experiences dizziness when standing or sitting since starting valsartan  on October 02, 2024, for hypertension. Reducing the dose to half a pill decreased dizziness by about 50%. He denies chest pain, shortness of breath, or headaches. He monitors his blood pressure at home.  He is under significant stress due to personal and family issues, including a financial scam and marital problems. Stress affects his concentration at work, where he works night shifts. He has difficulty sleeping, waking after 2-3 hours, and cannot return to sleep due to intrusive thoughts. He has not sought therapy or medication for stress but is considering taking time off work. He experiences anhedonia, feels down or hopeless, and worries excessively. He denies thoughts of self-harm. He has a poor appetite due to his current living situation and occasionally feels anxious when reflecting on past events.        10/04/2024    1:31 PM 09/06/2024    3:00 PM  Depression screen PHQ 2/9  Decreased Interest 3 1  Down, Depressed, Hopeless 1 1  PHQ - 2 Score 4 2  Altered sleeping 3 1  Tired, decreased energy 2 1  Change in appetite 1 0  Feeling bad or failure about yourself  0 0  Trouble concentrating 2 0  Moving slowly or fidgety/restless 0 0  Suicidal thoughts 0 0  PHQ-9 Score 12 4  Difficult doing work/chores Very difficult Somewhat difficult      10/04/2024    1:35 PM 09/06/2024    3:00 PM  GAD 7 : Generalized Anxiety Score  Nervous,  Anxious, on Edge 1 0  Control/stop worrying 2 2  Worry too much - different things 3 2  Trouble relaxing 2 2  Restless 2 2  Easily annoyed or irritable 2 2  Afraid - awful might happen 2 2  Total GAD 7 Score 14 12  Anxiety Difficulty Very difficult Somewhat difficult      ROS See pertinent positives and negatives per HPI.    Objective:     BP 102/68 (BP Location: Right Arm, Patient Position: Sitting, Cuff Size: Normal)   Pulse 68   Temp 97.7 F (36.5 C)   Ht 5' 6 (1.676 m)   Wt 139 lb 6.4 oz (63.2 kg)   SpO2 97%   BMI 22.50 kg/m  BP Readings from Last 3 Encounters:  10/04/24 102/68  09/06/24 (!) 170/100  10/16/15 122/70   Wt Readings from Last 3 Encounters:  10/04/24 139 lb 6.4 oz (63.2 kg)  09/06/24 139 lb 12.8 oz (63.4 kg)  10/16/15 165 lb (74.8 kg)      Physical Exam Vitals and nursing note reviewed.  Constitutional:      Appearance: Normal appearance.  HENT:     Head: Normocephalic.  Eyes:     Conjunctiva/sclera: Conjunctivae normal.  Cardiovascular:     Rate and Rhythm: Normal rate and regular rhythm.     Pulses: Normal pulses.     Heart sounds: Normal heart sounds.  Pulmonary:     Effort: Pulmonary effort is normal.     Breath sounds: Normal breath sounds.  Musculoskeletal:     Cervical back: Normal range of motion.  Skin:    General: Skin is warm.  Neurological:     General: No focal deficit present.     Mental Status: He is alert and oriented to person, place, and time.  Psychiatric:        Mood and Affect: Mood normal.        Behavior: Behavior normal.        Thought Content: Thought content normal.        Judgment: Judgment normal.     Assessment & Plan:   Problem List Items Addressed This Visit       Cardiovascular and Mediastinum   Primary hypertension - Primary   Chronic, ongoing. He experiences dizziness on standing due to blood pressure medication. Reducing the dose of valsartan  and hydrochlorothiazide  has decreased  dizziness by 50%. Will switch to valsartan  80mg  daily. Monitor blood pressure at home. Check BMP today. Follow up in 2-3 weeks to reassess.      Relevant Medications   valsartan  (DIOVAN ) 80 MG tablet   Other Relevant Orders   Basic metabolic panel with GFR     Other   Adjustment disorder with mixed anxiety and depressed mood   Chronic, not controlled. He is under significant stress from personal and family issues, leading to anxiety, concentration difficulties, poor sleep, and lack of interest. PHQ 9 is a 12 and GAD 7 is a 14. No SI/HI. Night shifts exacerbate sleep problems. Provide a work note for time off from October 12 to the end of the month. Encourage stress management techniques such as walking, deep breathing, and using tea for sleep. Declined therapy and medications. Follow-up in 2-3 weeks.       Other Visit Diagnoses       Positive colorectal cancer screening using Cologuard test       Phone number given for patient to call GI to schedule an appointment       Return in about 2 weeks (around 10/18/2024) for 2-3 weeks, before November, HTN.    Aaron DELENA Harada, NP

## 2024-10-09 NOTE — Telephone Encounter (Signed)
 Copied from CRM [id number]. Topic: Clinical - Lab/Test Results >> Oct 09, 2024  4:13 PM Rea ORN wrote: Reason for CRM: Pt daughter Randine returned missed call from [facility]. She was given lab results.

## 2024-10-28 ENCOUNTER — Telehealth: Payer: Self-pay | Admitting: Nurse Practitioner

## 2024-10-28 NOTE — Telephone Encounter (Signed)
 Patient dropped off document FMLA, to be filled out by provider. Patient requested to send it back via Call Patient to pick up within ASAP. Document is located in providers tray at front office.Please advise at [facility] [phone]

## 2024-10-28 NOTE — Telephone Encounter (Signed)
   CLINICAL USE BELOW THIS LINE (use X to signify action taken)  _X__ Form received and placed in providers office for signature. ___ Form completed and faxed to LOA Dept.  ___ Form completed & LVM to notify patient ready for pick up.  ___ Charge sheet and copy of form in front office folder for office supervisor.

## 2024-10-29 DIAGNOSIS — Z0279 Encounter for issue of other medical certificate: Secondary | ICD-10-CM

## 2024-10-29 NOTE — Telephone Encounter (Signed)
  CLINICAL USE BELOW THIS LINE (use X to signify action taken)   ___ Form received and placed in providers office for signature. _x__ Form completed and faxed to LOA Dept.  _x__ Form completed & LVM to notify patient ready for pick up.  __x_ Charge sheet and copy of form in front office folder for office supervisor.

## 2024-10-29 NOTE — Telephone Encounter (Signed)
 I called patient with the use of an interpreter and left a message that forms were completed and faxed and ready for pick up.  Forms placed at front office for pick up.

## 2025-01-16 ENCOUNTER — Encounter: Payer: Self-pay | Admitting: Internal Medicine

## 2025-01-17 ENCOUNTER — Inpatient Hospital Stay (HOSPITAL_COMMUNITY)
Admission: EM | Admit: 2025-01-17 | Discharge: 2025-01-22 | DRG: 326 | Disposition: A | Discharge status: Home / Self Care | Payer: Self-pay | Attending: Surgery | Admitting: Surgery

## 2025-01-17 DIAGNOSIS — I10 Essential (primary) hypertension: Secondary | ICD-10-CM | POA: Diagnosis present

## 2025-01-17 DIAGNOSIS — K275 Chronic or unspecified peptic ulcer, site unspecified, with perforation: Secondary | ICD-10-CM | POA: Diagnosis present

## 2025-01-17 DIAGNOSIS — K650 Generalized (acute) peritonitis: Secondary | ICD-10-CM | POA: Diagnosis present

## 2025-01-17 DIAGNOSIS — R198 Other specified symptoms and signs involving the digestive system and abdomen: Principal | ICD-10-CM

## 2025-01-17 DIAGNOSIS — Z603 Acculturation difficulty: Secondary | ICD-10-CM | POA: Diagnosis present

## 2025-01-17 DIAGNOSIS — Z886 Allergy status to analgesic agent status: Secondary | ICD-10-CM

## 2025-01-17 DIAGNOSIS — K255 Chronic or unspecified gastric ulcer with perforation: Principal | ICD-10-CM | POA: Diagnosis present

## 2025-01-17 HISTORY — DX: Essential (primary) hypertension: I10

## 2025-01-17 LAB — COMPREHENSIVE METABOLIC PANEL WITH GFR
ALT: 22 U/L (ref 0–44)
AST: 45 U/L — ABNORMAL HIGH (ref 15–41)
Albumin: 3.6 g/dL (ref 3.5–5.0)
Alkaline Phosphatase: 74 U/L (ref 38–126)
Anion gap: 10 (ref 5–15)
BUN: 24 mg/dL — ABNORMAL HIGH (ref 8–23)
CO2: 23 mmol/L (ref 22–32)
Calcium: 8.9 mg/dL (ref 8.9–10.3)
Chloride: 106 mmol/L (ref 98–111)
Creatinine, Ser: 1.03 mg/dL (ref 0.61–1.24)
GFR, Estimated: >60 mL/min
Glucose, Bld: 148 mg/dL — ABNORMAL HIGH (ref 70–99)
Potassium: 4.2 mmol/L (ref 3.5–5.1)
Sodium: 140 mmol/L (ref 135–145)
Total Bilirubin: 0.2 mg/dL (ref 0.0–1.2)
Total Protein: 6.6 g/dL (ref 6.5–8.1)

## 2025-01-17 LAB — I-STAT CHEM 8, ED
BUN: 33 mg/dL — ABNORMAL HIGH (ref 8–23)
Calcium, Ion: 1.06 mmol/L — ABNORMAL LOW (ref 1.15–1.40)
Chloride: 107 mmol/L (ref 98–111)
Creatinine, Ser: 1.1 mg/dL (ref 0.61–1.24)
Glucose, Bld: 141 mg/dL — ABNORMAL HIGH (ref 70–99)
HCT: 39 % (ref 39.0–52.0)
Hemoglobin: 13.3 g/dL (ref 13.0–17.0)
Potassium: 4.3 mmol/L (ref 3.5–5.1)
Sodium: 140 mmol/L (ref 135–145)
TCO2: 23 mmol/L (ref 22–32)

## 2025-01-17 LAB — CBC WITH DIFFERENTIAL/PLATELET
Abs Immature Granulocytes: 0.02 10*3/uL (ref 0.00–0.07)
Basophils Absolute: 0 10*3/uL (ref 0.0–0.1)
Basophils Relative: 0 %
Eosinophils Absolute: 0 10*3/uL (ref 0.0–0.5)
Eosinophils Relative: 0 %
HCT: 38.3 % — ABNORMAL LOW (ref 39.0–52.0)
Hemoglobin: 11.3 g/dL — ABNORMAL LOW (ref 13.0–17.0)
Immature Granulocytes: 0 %
Lymphocytes Relative: 16 %
Lymphs Abs: 1 10*3/uL (ref 0.7–4.0)
MCH: 25.1 pg — ABNORMAL LOW (ref 26.0–34.0)
MCHC: 29.5 g/dL — ABNORMAL LOW (ref 30.0–36.0)
MCV: 84.9 fL (ref 80.0–100.0)
Monocytes Absolute: 0.3 10*3/uL (ref 0.1–1.0)
Monocytes Relative: 5 %
Neutro Abs: 4.9 10*3/uL (ref 1.7–7.7)
Neutrophils Relative %: 79 %
Platelets: 199 10*3/uL (ref 150–400)
RBC: 4.51 MIL/uL (ref 4.22–5.81)
RDW: 15.7 % — ABNORMAL HIGH (ref 11.5–15.5)
WBC: 6.3 10*3/uL (ref 4.0–10.5)
nRBC: 0 % (ref 0.0–0.2)

## 2025-01-17 LAB — LIPASE, BLOOD: Lipase: 61 U/L — ABNORMAL HIGH (ref 11–51)

## 2025-01-17 LAB — TROPONIN T, HIGH SENSITIVITY
Troponin T High Sensitivity: 9 ng/L (ref 0–19)
Troponin T High Sensitivity: 10 ng/L (ref 0–19)

## 2025-01-17 MED ADMIN — Midazolam HCl Inj PF 2 MG/2ML (Base Equivalent): 2 mg | INTRAVENOUS | NDC 00409000125

## 2025-01-17 MED ADMIN — Acetaminophen IV Soln 10 MG/ML: 1000 mg | INTRAVENOUS | NDC 63323043441

## 2025-01-17 MED ADMIN — Pantoprazole Sodium For IV Soln 40 MG (Base Equiv): 80 mg | INTRAVENOUS | NDC 00008092351

## 2025-01-17 MED ADMIN — Hydromorphone HCl Inj 1 MG/ML: 0.5 mg | INTRAVENOUS | NDC 76045000901

## 2025-01-17 MED ADMIN — Ondansetron HCl Inj 4 MG/2ML (2 MG/ML): 4 mg | INTRAVENOUS | NDC 00409475518

## 2025-01-17 MED ADMIN — Rocuronium Bromide IV Soln Pref Syr 100 MG/10ML (10 MG/ML): 50 mg | INTRAVENOUS | NDC 99999070048

## 2025-01-17 MED ADMIN — Piperacillin Sod-Tazobactam Sod in Dex IV Sol 3-0.375GM/50ML: 3.375 g | INTRAVENOUS | NDC 00338963601

## 2025-01-17 MED ADMIN — Pantoprazole Sodium For IV Soln 40 MG (Base Equiv): 40 mg | INTRAVENOUS | NDC 00008092351

## 2025-01-17 MED ADMIN — henylephrine-NaCl Pref Syr 0.8 MG/10ML-0.9% (80 MCG/ML): 160 ug | INTRAVENOUS | NDC 65302050510

## 2025-01-17 MED ADMIN — Vasopressin IV Soln 20 Unit/ML (For IV Infusion): 1 [IU] | INTRAVENOUS | NDC 43598091406

## 2025-01-17 MED ADMIN — Sodium Chloride Irrigation Soln 0.9%: 1000 mL | NDC 99999050048

## 2025-01-17 MED ADMIN — Sodium Chloride Irrigation Soln 0.9%: 2000 mL | NDC 99999050048

## 2025-01-17 MED ADMIN — Sugammadex Sodium IV 200 MG/2ML (Base Equivalent): 200 mg | INTRAVENOUS | NDC 99999070036

## 2025-01-17 MED ADMIN — Sugammadex Sodium IV 200 MG/2ML (Base Equivalent): 100 mg | INTRAVENOUS | NDC 99999070036

## 2025-01-17 MED ADMIN — Phenylephrine-NaCl IV Solution 20 MG/250ML-0.9%: 30 ug/min | INTRAVENOUS | NDC 70004080840

## 2025-01-17 MED ADMIN — Dexamethasone Sod Phosphate Preservative Free Inj 10 MG/ML: 4 mg | INTRAVENOUS | NDC 25021005301

## 2025-01-17 MED ADMIN — Lidocaine HCl(Cardiac) IV PF Soln Pref Syr 100 MG/5ML (2%): 100 mg | INTRAVENOUS | NDC 00409132305

## 2025-01-17 MED ADMIN — Succinylcholine Chloride Sol Pref Syr 200 MG/10ML (20 MG/ML): 80 mg | INTRAVENOUS | NDC 99999070035

## 2025-01-17 MED ADMIN — Chlorhexidine Gluconate Soln 0.12%: 15 mL | OROMUCOSAL | NDC 99999020099

## 2025-01-17 MED ADMIN — Fentanyl Citrate Preservative Free (PF) Inj 100 MCG/2ML: 100 ug | INTRAVENOUS | NDC 72572017025

## 2025-01-17 MED ADMIN — Fentanyl Citrate PF Soln Prefilled Syringe 50 MCG/ML: 50 ug | INTRAVENOUS | NDC 63323080801

## 2025-01-17 MED ADMIN — Iohexol IV Soln 350 MG/ML: 100 mL | INTRAVENOUS | NDC 00407141491

## 2025-01-17 MED ADMIN — PROPOFOL 200 MG/20ML IV EMUL: 150 mg | INTRAVENOUS | NDC 00069020910

## 2025-01-17 MED FILL — Methocarbamol Inj 1000 MG/10ML: 500.0000 mg | INTRAMUSCULAR | Qty: 10 | Status: AC

## 2025-01-17 MED FILL — Piperacillin Sod-Tazobactam Sod in Dex IV Sol 3-0.375GM/50ML: 3.3750 g | INTRAVENOUS | Qty: 50 | Status: AC

## 2025-01-17 MED FILL — Pantoprazole Sodium For IV Soln 40 MG (Base Equiv): 80.0000 mg | INTRAVENOUS | Qty: 20 | Status: AC

## 2025-01-17 MED FILL — Enoxaparin Sodium Inj Soln Pref Syr 30 MG/0.3ML: 30.0000 mg | INTRAMUSCULAR | Qty: 0.3 | Status: AC

## 2025-01-17 NOTE — Op Note (Signed)
 Operative Note  Aaron Bender  [id number]  [id number]  01/17/2025   Surgeon: Mitzie Freund MD FACS   Assistant: Camellia Blush MD FACS   Procedure performed: Exploratory laparotomy, omental pedicle flap patch repair of perforated prepyloric ulcer   Preop diagnosis: Perforated viscus  Post-op diagnosis/intraop findings: Perforated prepyloric ulcer, 850 mL of pus in the abdominal cavity on entry with diffuse purulent peritonitis   Specimens: no Retained items: 19 French round Blake drain EBL: Minimal cc Complications: none   Description of procedure: After obtaining informed consent the patient was taken to the operating room and placed supine on operating room table where general endotracheal anesthesia was initiated, preoperative antibiotics were administered, SCDs applied, and a formal timeout was performed.  The abdomen was prepped and draped in usual sterile fashion and an upper midline laparotomy was created with immediate evacuation of air and immediately visible was copious pus throughout the abdominal cavity.  An Alexis wound protector was placed along with the balfour retractor and then pus was evacuated, about 850 mL.  The liver was then lifted and the stomach exposed and immediately visible was an approximately 5 mm clean edged perforation in the anterior prepyloric position. Two full-thickness 2-0 silk sutures were placed across the perforation and then a tongue of omentum was brought over the area to patch the ulcer; the silks were then tied down to secure this in place without strangulating it.  We then irrigated the abdominal cavity with at least 4 L of warm sterile saline, addressing all quadrants and evacuating as much remaining pus as possible.  Ultimately the effluent was clear.  A 19 French round Blake drain was introduced through a stab incision in the right lower quadrant and this was placed anterior and superior to the Advance auto  as well as along the infrahepatic region.   This is secured to the skin with a 2-0 nylon.  The fascia was then closed with running looped #1 PDS starting at either end and tying centrally.  The skin was loosely approximated with staples.  A honeycomb dressing was applied.  The patient was then awakened, extubated and taken to PACU in stable condition.    All counts were correct at the completion of the case.

## 2025-01-17 NOTE — ED Provider Notes (Signed)
 Patient actually seen by Dr. Griselda .  Please see her note.  Plan was to follow up on the CT. case discussed with Dr. Shona radiology.  Pt has a bowel perforation on CT scan.  No clear source noted , imaging findings are somewhat limited as patient had a CT angio protocol  Will start empiric antibiotics.  I will consult general surgery  Case discussed with general surgery.  Will see pt in the ed   Randol Simmonds, MD 01/17/25 [phone]

## 2025-01-17 NOTE — ED Provider Notes (Signed)
" °  [facility] Provider Note   CSN: [id number] Arrival date & time: 01/17/25  9450     Patient presents with: No chief complaint on file.   Granite Godman is a 64 y.o. male.   The history is provided by the patient. A language interpreter was used.   Ghazi Rumpf is a 64 y.o. male who presents to the Emergency Department complaining of chest pain and abdominal pain.  He presents to the emergency department by EMS from work for evaluation of sudden onset chest pain that radiates to his abdomen that started at 4 AM.  He describes pain as severe and sharp with 1 episode of foamy emesis.  No associated fever, diarrhea.  No prior similar symptoms.  He does have a history of hypertension.  He uses occasional tobacco.  No alcohol or drug use.  He does have a history of hernia repair in [place], no additional surgeries.    Prior to Admission medications  Not on File    Allergies: Aspirin    Review of Systems  All other systems reviewed and are negative.   Updated Vital Signs Temp 97.9 F (36.6 C) (Oral)   Physical Exam Vitals and nursing note reviewed.  Constitutional:      Appearance: He is well-developed.     Comments: Appears uncomfortable  HENT:     Head: Normocephalic and atraumatic.  Cardiovascular:     Rate and Rhythm: Normal rate and regular rhythm.     Heart sounds: No murmur heard. Pulmonary:     Effort: Pulmonary effort is normal. No respiratory distress.     Breath sounds: Normal breath sounds.  Abdominal:     Palpations: Abdomen is soft.     Tenderness: There is no rebound.     Comments: Mild generalized abdominal tenderness  Musculoskeletal:        General: No tenderness.     Comments: 2+ radial and DP pulses bilaterally.  Skin:    General: Skin is warm and dry.  Neurological:     Mental Status: He is alert and oriented to person, place, and time.  Psychiatric:        Behavior: Behavior normal.     (all labs  ordered are listed, but only abnormal results are displayed) Labs Reviewed - No data to display  EKG: None  Radiology: No results found.   Procedures   Medications Ordered in the ED - No data to display                                  Medical Decision Making Amount and/or Complexity of Data Reviewed Labs: ordered. Radiology: ordered.  Risk Prescription drug management.   Patient here for evaluation of chest pain and abdominal pain that started abruptly.  He did have 2 episodes of emesis prior to ED arrival.  He appears uncomfortable on examination with mild to moderate generalized tenderness.  EKG is nonischemic.  Plan to obtain a CTA to evaluate for dissection or additional pathology.  Patient care transferred pending CTA.     Final diagnoses:  None    ED Discharge Orders     None          Griselda Norris, MD 01/17/25 [phone]  "

## 2025-01-18 MED ADMIN — Acetaminophen IV Soln 10 MG/ML: 1000 mg | INTRAVENOUS | NDC 63323043441

## 2025-01-18 MED ADMIN — Pantoprazole Sodium For IV Soln 40 MG (Base Equiv): 80 mg | INTRAVENOUS | NDC 00008092351

## 2025-01-18 MED ADMIN — Piperacillin Sod-Tazobactam Sod in Dex IV Sol 3-0.375GM/50ML: 3.375 g | INTRAVENOUS | NDC 00338963601

## 2025-01-18 MED ADMIN — Enoxaparin Sodium Inj Soln Pref Syr 30 MG/0.3ML: 30 mg | SUBCUTANEOUS | NDC 71288043280

## 2025-01-18 MED ADMIN — Hydromorphone HCl Inj 1 MG/ML: 1 mg | INTRAVENOUS | NDC 76045000901

## 2025-01-19 MED ADMIN — Acetaminophen IV Soln 10 MG/ML: 1000 mg | INTRAVENOUS | NDC 63323043441

## 2025-01-19 MED ADMIN — Pantoprazole Sodium For IV Soln 40 MG (Base Equiv): 80 mg | INTRAVENOUS | NDC 00008092351

## 2025-01-19 MED ADMIN — Piperacillin Sod-Tazobactam Sod in Dex IV Sol 3-0.375GM/50ML: 3.375 g | INTRAVENOUS | NDC 00338963601

## 2025-01-19 MED ADMIN — Enoxaparin Sodium Inj Soln Pref Syr 30 MG/0.3ML: 30 mg | SUBCUTANEOUS | NDC 71288043280

## 2025-01-19 MED ADMIN — Hydromorphone HCl Inj 1 MG/ML: 1 mg | INTRAVENOUS | NDC 76045000901

## 2025-01-20 MED ADMIN — Acetaminophen IV Soln 10 MG/ML: 1000 mg | INTRAVENOUS | NDC 63323043441

## 2025-01-20 MED ADMIN — Pantoprazole Sodium For IV Soln 40 MG (Base Equiv): 80 mg | INTRAVENOUS | NDC 00008092351

## 2025-01-20 MED ADMIN — Methocarbamol Inj 1000 MG/10ML: 500 mg | INTRAVENOUS | NDC 55150022310

## 2025-01-20 MED ADMIN — Piperacillin Sod-Tazobactam Sod in Dex IV Sol 3-0.375GM/50ML: 3.375 g | INTRAVENOUS | NDC 00338963601

## 2025-01-20 MED ADMIN — Enoxaparin Sodium Inj Soln Pref Syr 30 MG/0.3ML: 30 mg | SUBCUTANEOUS | NDC 71288043280

## 2025-01-21 MED ADMIN — Pantoprazole Sodium For IV Soln 40 MG (Base Equiv): 80 mg | INTRAVENOUS | NDC 00008092351

## 2025-01-21 MED ADMIN — Methocarbamol Inj 1000 MG/10ML: 500 mg | INTRAVENOUS | NDC 55150022310

## 2025-01-21 MED ADMIN — Piperacillin Sod-Tazobactam Sod in Dex IV Sol 3-0.375GM/50ML: 3.375 g | INTRAVENOUS | NDC 00338963601

## 2025-01-21 MED ADMIN — Iohexol Inj 300 MG/ML: 50 mL | NDC 00407141361

## 2025-01-21 MED ADMIN — Enoxaparin Sodium Inj Soln Pref Syr 30 MG/0.3ML: 30 mg | SUBCUTANEOUS | NDC 71288043280

## 2025-01-21 MED ADMIN — Hydromorphone HCl Inj 1 MG/ML: 1 mg | INTRAVENOUS | NDC 76045000901

## 2025-01-22 ENCOUNTER — Other Ambulatory Visit (HOSPITAL_COMMUNITY): Payer: Self-pay

## 2025-01-22 ENCOUNTER — Encounter: Payer: Self-pay | Admitting: Nurse Practitioner

## 2025-01-22 LAB — BASIC METABOLIC PANEL WITH GFR
Anion gap: 9 (ref 5–15)
BUN: 12 mg/dL (ref 8–23)
CO2: 23 mmol/L (ref 22–32)
Calcium: 7.7 mg/dL — ABNORMAL LOW (ref 8.9–10.3)
Chloride: 109 mmol/L (ref 98–111)
Creatinine, Ser: 0.87 mg/dL (ref 0.61–1.24)
GFR, Estimated: >60 mL/min
Glucose, Bld: 90 mg/dL (ref 70–99)
Potassium: 3.7 mmol/L (ref 3.5–5.1)
Sodium: 141 mmol/L (ref 135–145)

## 2025-01-22 LAB — CBC
HCT: 29.1 % — ABNORMAL LOW (ref 39.0–52.0)
Hemoglobin: 8.9 g/dL — ABNORMAL LOW (ref 13.0–17.0)
MCH: 24.7 pg — ABNORMAL LOW (ref 26.0–34.0)
MCHC: 30.6 g/dL (ref 30.0–36.0)
MCV: 80.8 fL (ref 80.0–100.0)
Platelets: 131 10*3/uL — ABNORMAL LOW (ref 150–400)
RBC: 3.6 MIL/uL — ABNORMAL LOW (ref 4.22–5.81)
RDW: 15.6 % — ABNORMAL HIGH (ref 11.5–15.5)
WBC: 6.8 10*3/uL (ref 4.0–10.5)
nRBC: 0 % (ref 0.0–0.2)

## 2025-01-22 MED ORDER — PANTOPRAZOLE SODIUM 40 MG PO TBEC
40.0000 mg | DELAYED_RELEASE_TABLET | Freq: Two times a day (BID) | ORAL | 11 refills | Status: AC
  Filled 2025-01-22: qty 30, 15d supply, fill #0

## 2025-01-22 MED ORDER — METHOCARBAMOL 500 MG PO TABS
500.0000 mg | ORAL_TABLET | Freq: Four times a day (QID) | ORAL | 0 refills | Status: AC | PRN
  Filled 2025-01-22: qty 10, 3d supply, fill #0

## 2025-01-22 MED ORDER — POLYETHYLENE GLYCOL 3350 17 G PO PACK
17.0000 g | PACK | Freq: Every day | ORAL | Status: DC
  Administered 2025-01-22: 17 g via ORAL
  Filled 2025-01-22: qty 1

## 2025-01-22 MED ADMIN — Pantoprazole Sodium For IV Soln 40 MG (Base Equiv): 80 mg | INTRAVENOUS | NDC 00008092351

## 2025-01-22 MED ADMIN — Methocarbamol Inj 1000 MG/10ML: 500 mg | INTRAVENOUS | NDC 55150022310

## 2025-01-22 MED ADMIN — Piperacillin Sod-Tazobactam Sod in Dex IV Sol 3-0.375GM/50ML: 3.375 g | INTRAVENOUS | NDC 00338963601

## 2025-01-22 MED ADMIN — Enoxaparin Sodium Inj Soln Pref Syr 30 MG/0.3ML: 30 mg | SUBCUTANEOUS | NDC 71288043280

## 2025-01-22 NOTE — Progress Notes (Addendum)
 "  Progress Note  5 Days Post-Op  Subjective: Encounter completed using interpreting services.  Patient reports tolerating FLD. Denies nausea or vomiting. Denies worsening pain. Had BM and flatulence.   ROS  All negative with the exception of above.  Objective: Vital signs in last 24 hours: Temp:  [97.8 F (36.6 C)-98.4 F (36.9 C)] 98.4 F (36.9 C) (01/28 0558) Pulse Rate:  [58-75] 66 (01/28 0558) Resp:  [16-18] 17 (01/28 0558) BP: (138-153)/(84-93) 138/93 (01/28 0558) SpO2:  [99 %] 99 % (01/28 0558) Last BM Date : 01/20/25  Intake/Output from previous day: 01/27 0701 - 01/28 0700 In: 3015.1 [P.O.:240; I.V.:2625.1; IV Piggyback:150] Out: 615 [Urine:100; Emesis/NG output:300; Drains:215] Intake/Output this shift: Total I/O In: 408.2 [I.V.:408.2] Out: 30 [Drains:30]  PE: General: Pleasant male who is laying in bed in NAD. HEENT: Head is normocephalic, atraumatic. NGT has been removed. Heart: HR normal during encounter.  Lungs: Respiratory effort nonlabored on room air.  Abd: Soft, ND. Appropriate tenderness around midline incision. Incision with staples covered with honeycomb dressing. C/D/I. Right sided JP drain looks serous. No rebound tenderness or guarding.  Skin: Warm and dry.  Psych: A&Ox3 with an appropriate affect.    Lab Results:  Recent Labs    01/20/25 0417 01/22/25 0509  WBC 8.6 6.8  HGB 9.3* 8.9*  HCT 31.4* 29.1*  PLT 148* 131*   BMET Recent Labs    01/20/25 0417 01/22/25 0509  NA 143 141  K 3.8 3.7  CL 110 109  CO2 22 23  GLUCOSE 87 90  BUN 28* 12  CREATININE 1.12 0.87  CALCIUM 8.1* 7.7*   PT/INR No results for input(s): LABPROT, INR in the last 72 hours. CMP     Component Value Date/Time   NA 141 01/22/2025 0509   K 3.7 01/22/2025 0509   CL 109 01/22/2025 0509   CO2 23 01/22/2025 0509   GLUCOSE 90 01/22/2025 0509   BUN 12 01/22/2025 0509   CREATININE 0.87 01/22/2025 0509   CALCIUM 7.7 (L) 01/22/2025 0509   PROT 6.6  01/17/2025 0637   ALBUMIN  3.6 01/17/2025 0637   AST 45 (H) 01/17/2025 0637   ALT 22 01/17/2025 0637   ALKPHOS 74 01/17/2025 0637   BILITOT 0.2 01/17/2025 0637   GFRNONAA >60 01/22/2025 0509   Lipase     Component Value Date/Time   LIPASE 61 (H) 01/17/2025 0637       Studies/Results: DG UGI W SINGLE CM (SOL OR THIN BA) Result Date: 01/21/2025 CLINICAL DATA:  64 year old male status post exploratory laparotomy and graham patch of perforated ulcer 01/17/2025. EXAM: DG UGI W SINGLE CM TECHNIQUE: Single contrast examination was performed using Omnipaque  300. This exam was performed by Rayfield Buff, NP, and was supervised and interpreted by Dr. Jackquline Boxer. FLUOROSCOPY: Radiation Exposure Index (as provided by the fluoroscopic device): 8.3 mGy Kerma COMPARISON:  CT Angio Chest/Abdomen/Pelvis 01/17/2025 FINDINGS: Esophagus: Not evaluated.  Nasogastric tube present. Esophageal motility: Not evaluated.  Nasogastric tube present. Gastroesophageal reflux:  None visualized. Ingested 13mm barium tablet: Not given. Stomach: No evidence of leak. There is normal passage of the contrast from the stomach into the duodenum. Gastric emptying: Normal. Duodenum:  Normal appearance. Other:  None. IMPRESSION: 1.  No evidence of gastric  leak. 2.  Nasogastric tube in the distal stomach. Electronically Signed   By: Jackquline Boxer M.D.   On: 01/21/2025 13:31    Anti-infectives: Anti-infectives (From admission, onward)    Start  Dose/Rate Route Frequency Ordered Stop   01/17/25 1600  piperacillin -tazobactam (ZOSYN ) IVPB 3.375 g        3.375 g 12.5 mL/hr over 240 Minutes Intravenous Every 8 hours 01/17/25 0917     01/17/25 0800  piperacillin -tazobactam (ZOSYN ) IVPB 3.375 g        3.375 g 100 mL/hr over 30 Minutes Intravenous  Once 01/17/25 0745 01/17/25 0847        Assessment/Plan POD5: S/P laparotomy, exploratory, graham patch of perforated ulcer 01/17/2025 with Dr. Signe.  -Afebrile. -WBC  6.8 -HGB 8.9 from 9.3 -Upper GI 1/27 without gastric leak. NGT removed 1/27. -Drain output serous 215 mL. Drain can be removed. Orders in place. -Denies worsening pain. No n/v. Tolerating FLD. Will advance to soft. Having flatulence and BM. -Will arrange for medications at discharge. -Possible discharge this PM.    FEN: Soft VTE: Lovenox  ID: [email or web address]    LOS: 5 days   I reviewed specialist notes, nursing notes, last 24 h vitals and pain scores, last 48 h intake and output, last 24 h labs and trends, and last 24 h imaging results.  Marjorie Carlyon Favre, [facility] 01/22/2025, 9:46 AM Please see Amion for pager number during day hours 7:00am-4:30pm  "

## 2025-01-22 NOTE — Plan of Care (Signed)
" °  Problem: Education: Goal: Knowledge of the prescribed therapeutic regimen will improve Outcome: Adequate for Discharge   Problem: Bowel/Gastric: Goal: Gastrointestinal status for postoperative course will improve Outcome: Adequate for Discharge   Problem: Cardiac: Goal: Ability to maintain an adequate cardiac output Outcome: Adequate for Discharge Goal: Will show no evidence of cardiac arrhythmias Outcome: Adequate for Discharge   Problem: Nutritional: Goal: Will attain and maintain optimal nutritional status Outcome: Adequate for Discharge   Problem: Neurological: Goal: Will regain or maintain usual level of consciousness Outcome: Adequate for Discharge   Problem: Clinical Measurements: Goal: Ability to maintain clinical measurements within normal limits Outcome: Adequate for Discharge Goal: Postoperative complications will be avoided or minimized Outcome: Adequate for Discharge   Problem: Respiratory: Goal: Will regain and/or maintain adequate ventilation Outcome: Adequate for Discharge Goal: Respiratory status will improve Outcome: Adequate for Discharge   Problem: Skin Integrity: Goal: Demonstrates signs of wound healing without infection Outcome: Adequate for Discharge   Problem: Urinary Elimination: Goal: Will remain free from infection Outcome: Adequate for Discharge Goal: Ability to achieve and maintain adequate urine output Outcome: Adequate for Discharge   Problem: Education: Goal: Knowledge of General Education information will improve Description: Including pain rating scale, medication(s)/side effects and non-pharmacologic comfort measures Outcome: Adequate for Discharge   Problem: Health Behavior/Discharge Planning: Goal: Ability to manage health-related needs will improve Outcome: Adequate for Discharge   Problem: Clinical Measurements: Goal: Ability to maintain clinical measurements within normal limits will improve Outcome: Adequate for  Discharge Goal: Will remain free from infection Outcome: Adequate for Discharge Goal: Diagnostic test results will improve Outcome: Adequate for Discharge Goal: Respiratory complications will improve Outcome: Adequate for Discharge Goal: Cardiovascular complication will be avoided Outcome: Adequate for Discharge   Problem: Activity: Goal: Risk for activity intolerance will decrease Outcome: Adequate for Discharge   Problem: Nutrition: Goal: Adequate nutrition will be maintained Outcome: Adequate for Discharge   Problem: Coping: Goal: Level of anxiety will decrease Outcome: Adequate for Discharge   Problem: Elimination: Goal: Will not experience complications related to bowel motility Outcome: Adequate for Discharge Goal: Will not experience complications related to urinary retention Outcome: Adequate for Discharge   Problem: Pain Managment: Goal: General experience of comfort will improve and/or be controlled Outcome: Adequate for Discharge   Problem: Safety: Goal: Ability to remain free from injury will improve Outcome: Adequate for Discharge   Problem: Skin Integrity: Goal: Risk for impaired skin integrity will decrease Outcome: Adequate for Discharge   "

## 2025-01-22 NOTE — Progress Notes (Signed)
 All questions and concerns answered prior to discharged. Patient left hospital via family's transportation. Exited hospital front entrance via wheelchair.

## 2025-01-23 NOTE — Discharge Summary (Signed)
 [facility] Discharge Summary   Patient ID: Aaron Bender MRN: [id number] DOB/AGE: [birth year] 64 y.o.  Admit date: 01/17/2025 Discharge date: 01/23/2024  Admitting Diagnosis: Abdominal pain Pneumoperitoneum with free fluid  Discharge Diagnosis Patient Active Problem List   Diagnosis Date Noted   Perforated ulcer (HCC) 01/17/2025   Adjustment disorder with mixed anxiety and depressed mood 10/04/2024   Chronic gout involving toe of right foot without tophus 09/06/2024   Primary hypertension 09/06/2024   Routine general medical examination at a health care facility 10/16/2015   Consultants General surgery  Imaging: Chest Port 1 view - 01/17/2025 CT Angio Chest/Abd/Pel for Dissection W and/or W/WO - 01/17/2025 UGI W Single CM - 01/21/2025  Procedures Dr. Mitzie Freund (01/17/25) - Exploratory laparotomy, omental pedicle flap patch repair of perforated prepyloric ulcer   Hospital Course:  Aaron Bender is a 64 year old male who presented to the ED with abdominal pain. Workup showed concerns for penumoperitoneum and free fluid. Peritonitis was noted on exam. Patient was admitted and underwent procedure listed above. Tolerated procedure well and was transferred to the floor. Patient's NGT was removed 01/21/2025. Patient abdominal drain was removed 01/22/2025. Diet was advanced as tolerated. On POD5, the patient was voiding well, tolerating diet, ambulating well, pain well controlled, vital signs stable, incisions c/d/i and felt stable for discharge home.  Patient will follow up in our office and knows to call with questions or concerns.  He will call to confirm appointment date/time.    Physical Exam: General: Pleasant male who is laying in bed in NAD. HEENT: Head is normocephalic, atraumatic. NGT has been removed. Heart: HR normal during encounter.  Lungs: Respiratory effort nonlabored on room air.  Abd: Soft, ND. Appropriate tenderness around midline incision. Incision with staples  covered with honeycomb dressing. C/D/I. Right sided JP drain present and looks serous. Removed prior to discharge. No rebound tenderness or guarding.  Skin: Warm and dry.  Psych: A&Ox3 with an appropriate affect.   I or a member of my team have reviewed this patient in the Controlled Substance Database.   Allergies as of 01/22/2025       Reactions   Aspirin Hives, Rash   Aspirin Rash        Medication List     TAKE these medications    methocarbamol  500 MG tablet Commonly known as: ROBAXIN  Take 1 tablet (500 mg total) by mouth every 6 (six) hours as needed for muscle spasms.   pantoprazole  40 MG tablet Commonly known as: Protonix  Take 1 tablet (40 mg total) by mouth 2 (two) times daily.   valsartan  80 MG tablet Commonly known as: DIOVAN  Take 40 mg by mouth daily.          Follow-up Information     Freund Mitzie LABOR, MD. Go on 02/13/2025.   Specialty: General Surgery Why: At 9:50AM, Call to confirm your follow up appointment, Arrive 30 mins prior to scheduled appointment time, Please bring your ID and insurance cards with you Contact information: [address] [place] [address] [phone]         [facility], [place]. Go on 01/30/2025.   Specialty: General Surgery Why: At 9:30AM, Call to confirm nurse visit to have staples removed, Arrive 30 mins prior to scheduled appointment time, Please bring your ID and insurance cards to appointment. Contact information: [address] [place]  [address] [phone]  Signed: Marjorie Carlyon Favre , [facility] 01/23/2025, 10:44 AM Please see Amion for pager number during day hours 7:00am-4:30pm
